# Patient Record
Sex: Female | Born: 1965 | Race: White | Hispanic: No | Marital: Married | Smoking: Former smoker
Health system: Southern US, Community
[De-identification: ages and names within clinical notes are randomized; demographics above are authoritative.]

## PROBLEM LIST (undated history)

## (undated) DIAGNOSIS — T7840XA Allergy, unspecified, initial encounter: Secondary | ICD-10-CM

## (undated) DIAGNOSIS — F419 Anxiety disorder, unspecified: Secondary | ICD-10-CM

## (undated) DIAGNOSIS — K589 Irritable bowel syndrome without diarrhea: Secondary | ICD-10-CM

## (undated) DIAGNOSIS — F32A Depression, unspecified: Secondary | ICD-10-CM

## (undated) DIAGNOSIS — Z8719 Personal history of other diseases of the digestive system: Secondary | ICD-10-CM

## (undated) DIAGNOSIS — K219 Gastro-esophageal reflux disease without esophagitis: Secondary | ICD-10-CM

## (undated) DIAGNOSIS — M1990 Unspecified osteoarthritis, unspecified site: Secondary | ICD-10-CM

## (undated) DIAGNOSIS — D649 Anemia, unspecified: Secondary | ICD-10-CM

## (undated) DIAGNOSIS — F329 Major depressive disorder, single episode, unspecified: Secondary | ICD-10-CM

## (undated) DIAGNOSIS — E785 Hyperlipidemia, unspecified: Secondary | ICD-10-CM

## (undated) HISTORY — DX: Hyperlipidemia, unspecified: E78.5

## (undated) HISTORY — DX: Irritable bowel syndrome, unspecified: K58.9

## (undated) HISTORY — PX: TUBAL LIGATION: SHX77

## (undated) HISTORY — DX: Depression, unspecified: F32.A

## (undated) HISTORY — DX: Allergy, unspecified, initial encounter: T78.40XA

## (undated) HISTORY — DX: Personal history of other diseases of the digestive system: Z87.19

## (undated) HISTORY — DX: Unspecified osteoarthritis, unspecified site: M19.90

## (undated) HISTORY — DX: Gastro-esophageal reflux disease without esophagitis: K21.9

## (undated) HISTORY — PX: UPPER GASTROINTESTINAL ENDOSCOPY: SHX188

## (undated) HISTORY — DX: Anxiety disorder, unspecified: F41.9

## (undated) HISTORY — PX: WISDOM TOOTH EXTRACTION: SHX21

## (undated) HISTORY — PX: TOOTH EXTRACTION: SUR596

## (undated) HISTORY — DX: Anemia, unspecified: D64.9

## (undated) HISTORY — PX: POLYPECTOMY: SHX149

## (undated) HISTORY — PX: COLONOSCOPY: SHX174

## (undated) HISTORY — DX: Major depressive disorder, single episode, unspecified: F32.9

---

## 2000-03-25 ENCOUNTER — Other Ambulatory Visit: Admission: RE | Admit: 2000-03-25 | Discharge: 2000-03-25 | Payer: Self-pay | Admitting: Obstetrics and Gynecology

## 2002-04-17 ENCOUNTER — Other Ambulatory Visit: Admission: RE | Admit: 2002-04-17 | Discharge: 2002-04-17 | Payer: Self-pay | Admitting: Obstetrics & Gynecology

## 2004-05-17 ENCOUNTER — Ambulatory Visit: Payer: Self-pay | Admitting: Internal Medicine

## 2004-06-23 ENCOUNTER — Ambulatory Visit: Payer: Self-pay | Admitting: Internal Medicine

## 2005-04-30 HISTORY — PX: ENDOMETRIAL ABLATION: SHX621

## 2006-07-31 ENCOUNTER — Ambulatory Visit: Payer: Self-pay | Admitting: Internal Medicine

## 2006-08-08 ENCOUNTER — Ambulatory Visit: Payer: Self-pay | Admitting: Internal Medicine

## 2006-08-08 ENCOUNTER — Encounter (INDEPENDENT_AMBULATORY_CARE_PROVIDER_SITE_OTHER): Payer: Self-pay | Admitting: Specialist

## 2006-08-08 DIAGNOSIS — Z860100 Personal history of colon polyps, unspecified: Secondary | ICD-10-CM | POA: Insufficient documentation

## 2006-09-06 ENCOUNTER — Ambulatory Visit: Payer: Self-pay | Admitting: Internal Medicine

## 2007-01-22 ENCOUNTER — Ambulatory Visit (HOSPITAL_COMMUNITY): Admission: RE | Admit: 2007-01-22 | Discharge: 2007-01-22 | Payer: Self-pay | Admitting: Obstetrics & Gynecology

## 2009-08-22 ENCOUNTER — Encounter (INDEPENDENT_AMBULATORY_CARE_PROVIDER_SITE_OTHER): Payer: Self-pay | Admitting: *Deleted

## 2009-09-06 ENCOUNTER — Encounter (INDEPENDENT_AMBULATORY_CARE_PROVIDER_SITE_OTHER): Payer: Self-pay | Admitting: *Deleted

## 2009-09-06 ENCOUNTER — Telehealth: Payer: Self-pay | Admitting: Internal Medicine

## 2009-09-09 ENCOUNTER — Encounter (INDEPENDENT_AMBULATORY_CARE_PROVIDER_SITE_OTHER): Payer: Self-pay | Admitting: *Deleted

## 2009-10-13 ENCOUNTER — Encounter (INDEPENDENT_AMBULATORY_CARE_PROVIDER_SITE_OTHER): Payer: Self-pay | Admitting: *Deleted

## 2009-10-17 ENCOUNTER — Ambulatory Visit: Payer: Self-pay | Admitting: Internal Medicine

## 2009-10-20 ENCOUNTER — Telehealth (INDEPENDENT_AMBULATORY_CARE_PROVIDER_SITE_OTHER): Payer: Self-pay | Admitting: *Deleted

## 2009-10-28 ENCOUNTER — Ambulatory Visit: Payer: Self-pay | Admitting: Internal Medicine

## 2010-05-30 NOTE — Letter (Signed)
Summary: Out of Work  [facility]  [address] [place], [place] [address]   Phone: [phone]  Fax: [phone]    09/09/2009  TO:   Charlcie Cradle FAX:   [phone]  RE: Amanda Parrish [address] [id number]       The above named individual is currently under my care and will be out of work    FROM:    10/28/09     THROUGH:   10/28/09    REASON:   Colonoscopy and required prep    MAY RETURN ON: 11/01/09     If you have any further questions or need additional information, please call.     Sincerely,    Iva Boop, MD typed by: Francee Piccolo CMA (AAMA)

## 2010-05-30 NOTE — Letter (Signed)
Summary: [facility] Instructions  [facility]  [address] [place], [place] [address]   Phone: [phone]  Fax: [phone]       Amanda Parrish    June 04, 1965    MRN: [id number]        Procedure Day /Date: Friday 10/28/2009     Arrival Time: 9:30 am      Procedure Time: 10:30 am     Location of Procedure:                    _x _  [facility] (4th Floor)                        PREPARATION FOR COLONOSCOPY WITH MOVIPREP   Starting 5 days prior to your procedure Sunday 6/26 do not eat nuts, seeds, popcorn, corn, beans, peas,  salads, or any raw vegetables.  Do not take any fiber supplements (e.g. Metamucil, Citrucel, and Benefiber).  THE DAY BEFORE YOUR PROCEDURE         DATE: Thursday 6/30 1.  Drink clear liquids the entire day-NO SOLID FOOD  2.  Do not drink anything colored red or purple.  Avoid juices with pulp.  No orange juice.  3.  Drink at least 64 oz. (8 glasses) of fluid/clear liquids during the day to prevent dehydration and help the prep work efficiently.  CLEAR LIQUIDS INCLUDE: Water Jello Ice Popsicles Tea (sugar ok, no milk/cream) Powdered fruit flavored drinks Coffee (sugar ok, no milk/cream) Gatorade Juice: apple, white grape, white cranberry  Lemonade Clear bullion, consomm, broth Carbonated beverages (any kind) Strained chicken noodle soup Hard Candy                             4.  In the morning, mix first dose of MoviPrep solution:    Empty 1 Pouch A and 1 Pouch B into the disposable container    Add lukewarm drinking water to the top line of the container. Mix to dissolve    Refrigerate (mixed solution should be used within 24 hrs)  5.  Begin drinking the prep at 5:00 p.m. The MoviPrep container is divided by 4 marks.   Every 15 minutes drink the solution down to the next mark (approximately 8 oz) until the full liter is complete.   6.  Follow completed prep with 16 oz of clear liquid of your choice (Nothing  red or purple).  Continue to drink clear liquids until bedtime.  7.  Before going to bed, mix second dose of MoviPrep solution:    Empty 1 Pouch A and 1 Pouch B into the disposable container    Add lukewarm drinking water to the top line of the container. Mix to dissolve    Refrigerate  THE DAY OF YOUR PROCEDURE      DATE: Friday 7/1  Beginning at 5:30 a.m. (5 hours before procedure):         1. Every 15 minutes, drink the solution down to the next mark (approx 8 oz) until the full liter is complete.  2. Follow completed prep with 16 oz. of clear liquid of your choice.    3. You may drink clear liquids until 8:30 am (2 HOURS BEFORE PROCEDURE).   MEDICATION INSTRUCTIONS  Unless otherwise instructed, you should take regular prescription medications with a small sip of water   as early as possible the morning of your procedure.  Diabetic patients - see separate instructions.  Stop taking Plavix or Aggrenox on  _  _  (7 days before procedure).     Stop taking Coumadin on  _ _  (5 days before procedure).  Additional medication instructions: _         OTHER INSTRUCTIONS  You will need a responsible adult at least 44 years of age to accompany you and drive you home.   This person must remain in the waiting room during your procedure.  Wear loose fitting clothing that is easily removed.  Leave jewelry and other valuables at home.  However, you may wish to bring a book to read or  an iPod/MP3 player to listen to music as you wait for your procedure to start.  Remove all body piercing jewelry and leave at home.  Total time from sign-in until discharge is approximately 2-3 hours.  You should go home directly after your procedure and rest.  You can resume normal activities the  day after your procedure.  The day of your procedure you should not:   Drive   Make legal decisions   Operate machinery   Drink alcohol   Return to work  You will receive specific  instructions about eating, activities and medications before you leave.    The above instructions have been reviewed and explained to me by   _______________________    I fully understand and can verbalize these instructions _____________________________ Date _________

## 2010-05-30 NOTE — Progress Notes (Signed)
Summary: prep ?'s   Phone Note Call from Patient Call back at [phone]   Caller: Patient Call For: Dr. Leone Payor Summary of Call: prep ?'s Initial call taken by: Vallarie Mare,  October 20, 2009 4:38 PM  Follow-up for Phone Call        Left message Follow-up by: Wyona Almas RN,  October 21, 2009 7:44 AM  Additional Follow-up for Phone Call Additional follow up Details #1::        Answered questions re: foods allowed 5 days prior to colon. Additional Follow-up by: Wyona Almas RN,  October 21, 2009 9:45 AM

## 2010-05-30 NOTE — Miscellaneous (Signed)
Summary: REC COL...AS.  Clinical Lists Changes  Medications: Added new medication of MOVIPREP 100 GM  SOLR (PEG-KCL-NACL-NASULF-NA ASC-C) As directed - Signed Rx of MOVIPREP 100 GM  SOLR (PEG-KCL-NACL-NASULF-NA ASC-C) As directed;  #1 x 0;  Signed;  Entered by: Clide Cliff RN;  Authorized by: Iva Boop MD, [facility];  Method used: [facility]  [id number] [address], [place], [place], [place]  [address], Ph: [phone], Fax: [phone] Observations: Added new observation of ALLERGY REV: Done (10/17/2009 14:20)    Prescriptions: MOVIPREP 100 GM  SOLR (PEG-KCL-NACL-NASULF-NA ASC-C) As directed  #1 x 0   Entered by:   Clide Cliff RN   Authorized by:   Iva Boop MD, [facility]   Signed by:   Clide Cliff RN on 10/17/2009   Method used:   [facility]  [address] [id number]       [address]       [place], [place]  [address]       Ph: [phone]       Fax: [phone]   RxID:   [id number]

## 2010-05-30 NOTE — Procedures (Signed)
Summary: Colonoscopy  Patient: Amanda Parrish Note: All result statuses are Final unless otherwise noted.  Tests: (1) Colonoscopy (COL)   COL Colonoscopy           DONE     [facility]     [address]     [place], [place]  [address]           COLONOSCOPY PROCEDURE REPORT           PATIENT:  Amanda Parrish, Amanda Parrish  MR#:  [id number]     BIRTHDATE:  1966-01-14, 43 yrs. old  GENDER:  female     ENDOSCOPIST:  Iva Boop, MD, [facility]           PROCEDURE DATE:  10/28/2009     PROCEDURE:  Colonoscopy [address]     ASA CLASS:  Class I     INDICATIONS:  surveillance and high-risk screening, history of     pre-cancerous (adenomatous) colon polyps two diminutive adenomas     removed 07/2006     mother, maternal aunt, great maternal aunt all have polyps     MEDICATIONS:   Benadryl 25 mg IV, Versed 9 mg IV, Fentanyl 75 mcg     IV           DESCRIPTION OF PROCEDURE:   After the risks benefits and     alternatives of the procedure were thoroughly explained, informed     consent was obtained.  Digital rectal exam was performed and     revealed no abnormalities.   The LB [id number]-[id number] endoscope     was introduced through the anus and advanced to the cecum, which     was identified by both the appendix and ileocecal valve, without     limitations.  The quality of the prep was adequate, using     MoviPrep.  The instrument was then slowly withdrawn as the colon     was fully examined. Insertion: 6:54 minutes Withdrawal: 9:43     minutes     <<PROCEDUREIMAGES>>           FINDINGS:  A normal appearing cecum, ileocecal valve, and     appendiceal orifice were identified. The ascending, hepatic     flexure, transverse, splenic flexure, descending, sigmoid colon,     and rectum appeared unremarkable.   Retroflexed views in the right     colon and  rectum revealed no abnormalities.    The scope was then     withdrawn from the patient and the procedure completed.           COMPLICATIONS:   None     ENDOSCOPIC IMPRESSION:     1) Normal colonoscopy, adequate prep     2) Personal history of adenomatous polyps and family history of     polyps.           RECOMMENDATIONS:     Use deep sedation next time. Strong IBS response to scope in     left colon.           REPEAT EXAM:  In 5 year(s) for routine screening colonoscopy.           Iva Boop, MD, [facility]           CC:  Herb Grays, MD     The Patient           n.     Rosalie Doctor:   Iva Boop at 10/28/2009 10:58 AM  Aashi, Derrington, [phone]  Note: An exclamation mark (!) indicates a result that was not dispersed into the flowsheet. Document Creation Date: 10/28/2009 10:58 AM _______________________________________________________________________  (1) Order result status: Final Collection or observation date-time: 10/28/2009 10:48 Requested date-time:  Receipt date-time:  Reported date-time:  Referring Physician:   Ordering Physician: Stan Head [phone]) Specimen Source:  Source: Launa Grill Order Number: [id number] Lab site:   Appended Document: Colonoscopy    Clinical Lists Changes  Observations: Added new observation of COLONNXTDUE: 10/2014 (10/28/2009 11:32)

## 2010-05-30 NOTE — Letter (Signed)
Summary: Colonoscopy Letter  [facility]  [address] [place], [place] [address]   Phone: [phone]  Fax: [phone]      August 22, 2009 MRN: [id number]   GUIDA ASMAN [address] [facility] [address] [place]  [address]   Dear [name],   According to your medical record, it is time for you to schedule a Colonoscopy. The American Cancer Society recommends this procedure as a method to detect early colon cancer. Patients with a family history of colon cancer, or a personal history of colon polyps or inflammatory bowel disease are at increased risk.  This letter has beeen generated based on the recommendations made at the time of your procedure. If you feel that in your particular situation this may no longer apply, please contact our office.  Please call our office at [phone] to schedule this appointment or to update your records at your earliest convenience.  Thank you for cooperating with [place] to provide you with the very best care possible.   Sincerely,  Iva Boop, M.D.  [facility] Gastroenterology Division [phone]

## 2010-05-30 NOTE — Letter (Signed)
Summary: Previsit letter  [facility]  [address] [place], [place] [address]   Phone: [phone]  Fax: [phone]       09/06/2009 MRN: [id number]  Amanda Parrish [address] [place]  [address]  Dear Ms. Amanda Parrish,  Welcome to the Gastroenterology Division at [facility].    You are scheduled to see a nurse for your pre-procedure visit on 10/17/2009 at 2:30PM on the 3rd floor at [facility], [address]  We ask that you try to arrive at our office 15 minutes prior to your appointment time to allow for check-in.  Your nurse visit will consist of discussing your medical and surgical history, your immediate family medical history, and your medications.    Please bring a complete list of all your medications or, if you prefer, bring the medication bottles and we will list them.  We will need to be aware of both prescribed and over the counter drugs.  We will need to know exact dosage information as well.  If you are on blood thinners (Coumadin, Plavix, Aggrenox, Ticlid, etc.) please call our office today/prior to your appointment, as we need to consult with your physician about holding your medication.   Please be prepared to read and sign documents such as consent forms, a financial agreement, and acknowledgement forms.  If necessary, and with your consent, a friend or relative is welcome to sit-in on the nurse visit with you.  Please bring your insurance card so that we may make a copy of it.  If your insurance requires a referral to see a specialist, please bring your referral form from your primary care physician.  No co-pay is required for this nurse visit.     If you cannot keep your appointment, please call [phone] to cancel or reschedule prior to your appointment date.  This allows [place] the opportunity to schedule an appointment for another patient in need of care.    Thank you for choosing [facility] for your medical needs.   We appreciate the opportunity to care for you.  Please visit [place] at our website  to learn more about our practice.                     Sincerely.                                                                                                                   The Gastroenterology Division

## 2010-05-30 NOTE — Progress Notes (Signed)
Summary: Appt verification   Phone Note Call from Patient   Call For: Dr Leone Payor Summary of Call: Needs a fax sent to her employer. Appt falls on 10-28-09 right before the holliday. Faxed to [facility] [phone]. Initial call taken by: Leanor Kail [facility],  Sep 06, 2009 5:02 PM  Follow-up for Phone Call        [facility] to pt.  Messgae left for pt to return call. Francee Piccolo CMA Duncan Dull)  Sep 09, 2009 12:15 PM   RC from pt.  Pt requests a letter stating she is having a colonoscopy and will need to be absent from work the entire day.  Per patient her employer does not want to grant time off before a holiday.  Advised pt I would fax a letter today.  Verified supervisor's name and fax number. Follow-up by: Francee Piccolo CMA Duncan Dull),  Sep 09, 2009 12:47 PM

## 2010-09-12 NOTE — Assessment & Plan Note (Signed)
[facility]                         GASTROENTEROLOGY OFFICE NOTE   NAME:Amanda Parrish                    MRN:          [id number]  DATE:09/06/2006                            DOB:          [birth year]    CHIEF COMPLAINT:  Constipation, bloating.   Ms. Amanda Parrish had a colonoscopy recently.  I had seen her in 2006.  See  that workup for full details.  She previously had iron deficiency anemia  problems.  A colonoscopy was recommended at that time, but she did not  have it.  Dr. Collins Scotland sent her back.  She had 2 small adenomatous polyps.  She has a family history of colon cancer in 2nd degree and 3rd degree  relatives, and her mother and other 2nd degree relatives have polyps.  She will come back in 3 years.  She had a strong IBS response and mild  melanosis coli.  She moves her bowels once a week or so using stool  softeners.  She exercises regularly.  She is fit.  She drinks a lot of  water, but it just does not help her move her bowels.   MEDICATIONS:  1. Lexapro 10 mg daily.  2. Clonipin 0.5 mg.  3. Stool softener.  4. Claritin p.r.n.   DRUG ALLERGIES:  None known.   PAST MEDICAL HISTORY:  1. Depression and anxiety.  2. Allergic rhinosinusitis.  3. Dyslipidemia.  4. Iron deficiency anemia, treated, resolved, apparently.  5. Esophageal stricture with esophageal dilation, June 23, 2004.  6. Small hiatal hernia.  7. Dysphagia, probably responded to clonazepam.  8. Negative workup for sprue with serologies.   PHYSICAL EXAMINATION:  Weight 139 pounds, pulse 64, blood pressure  112/50.   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  She knows she cannot tolerate gas-forming foods.  This is  a chronic problem for her.  She is doing everything she can, it sounds  like, with diet and exercise.   PLAN:  1. I would avoid pushing fiber in this lady with the gas and bloating.      We are going to start  her on the probiotic Align.  2. MiraLax 1 or 2 doses a day, titrating for effect.  She does have      some occasional times where she has multiple bowel movements and      kind of unloads things in a somewhat alternating fashion, but that      is rare.  She can skip it around those times.  3. If that fails to work, I have asked her to come back after a month      or more trial of this, and      we can consider Amitiza.  4. Also could consider antibiotics given the gas and bloating, but      will try this approach first.     Iva Boop, MD,FACG  Electronically Signed    CEG/MedQ  DD: 09/06/2006  DT: 09/06/2006  Job #: [id number]   cc:   Tammy R. Collins Scotland, M.D.
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
## 2010-10-18 ENCOUNTER — Other Ambulatory Visit: Payer: Self-pay | Admitting: Obstetrics and Gynecology

## 2010-10-18 DIAGNOSIS — R928 Other abnormal and inconclusive findings on diagnostic imaging of breast: Secondary | ICD-10-CM

## 2010-10-23 ENCOUNTER — Ambulatory Visit
Admission: RE | Admit: 2010-10-23 | Discharge: 2010-10-23 | Disposition: A | Discharge status: Home / Self Care | Payer: BC Managed Care – PPO | Source: Ambulatory Visit | Attending: Obstetrics and Gynecology | Admitting: Obstetrics and Gynecology

## 2010-10-23 DIAGNOSIS — R928 Other abnormal and inconclusive findings on diagnostic imaging of breast: Secondary | ICD-10-CM

## 2010-10-23 IMAGING — MG MM DIGITAL DIAGNOSTIC UNILAT R {BCG}
2 series · 2 of 2 positions shown · non-contrast
Comparison: [DATE] baseline screening mammogram.

CLINICAL DATA: Recall from screening mammography

DIGITAL DIAGNOSTIC RIGHT BREAST MAMMOGRAM

[R CC]
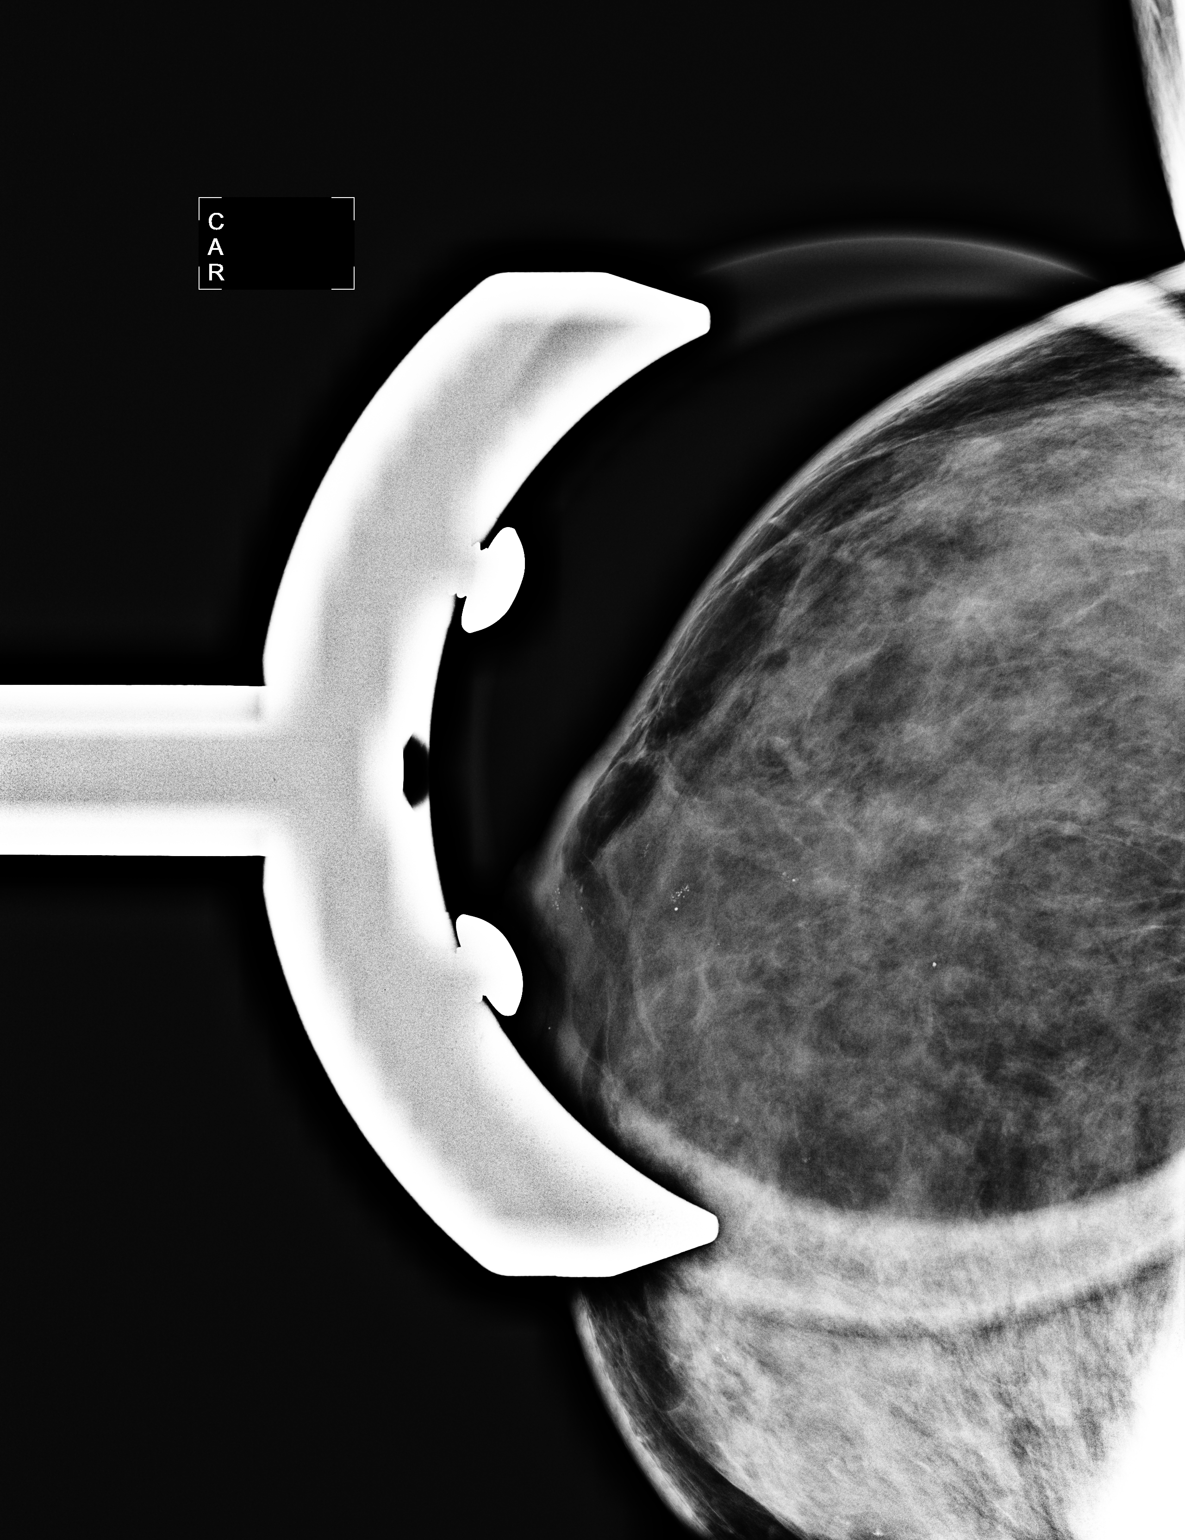

[R ML]
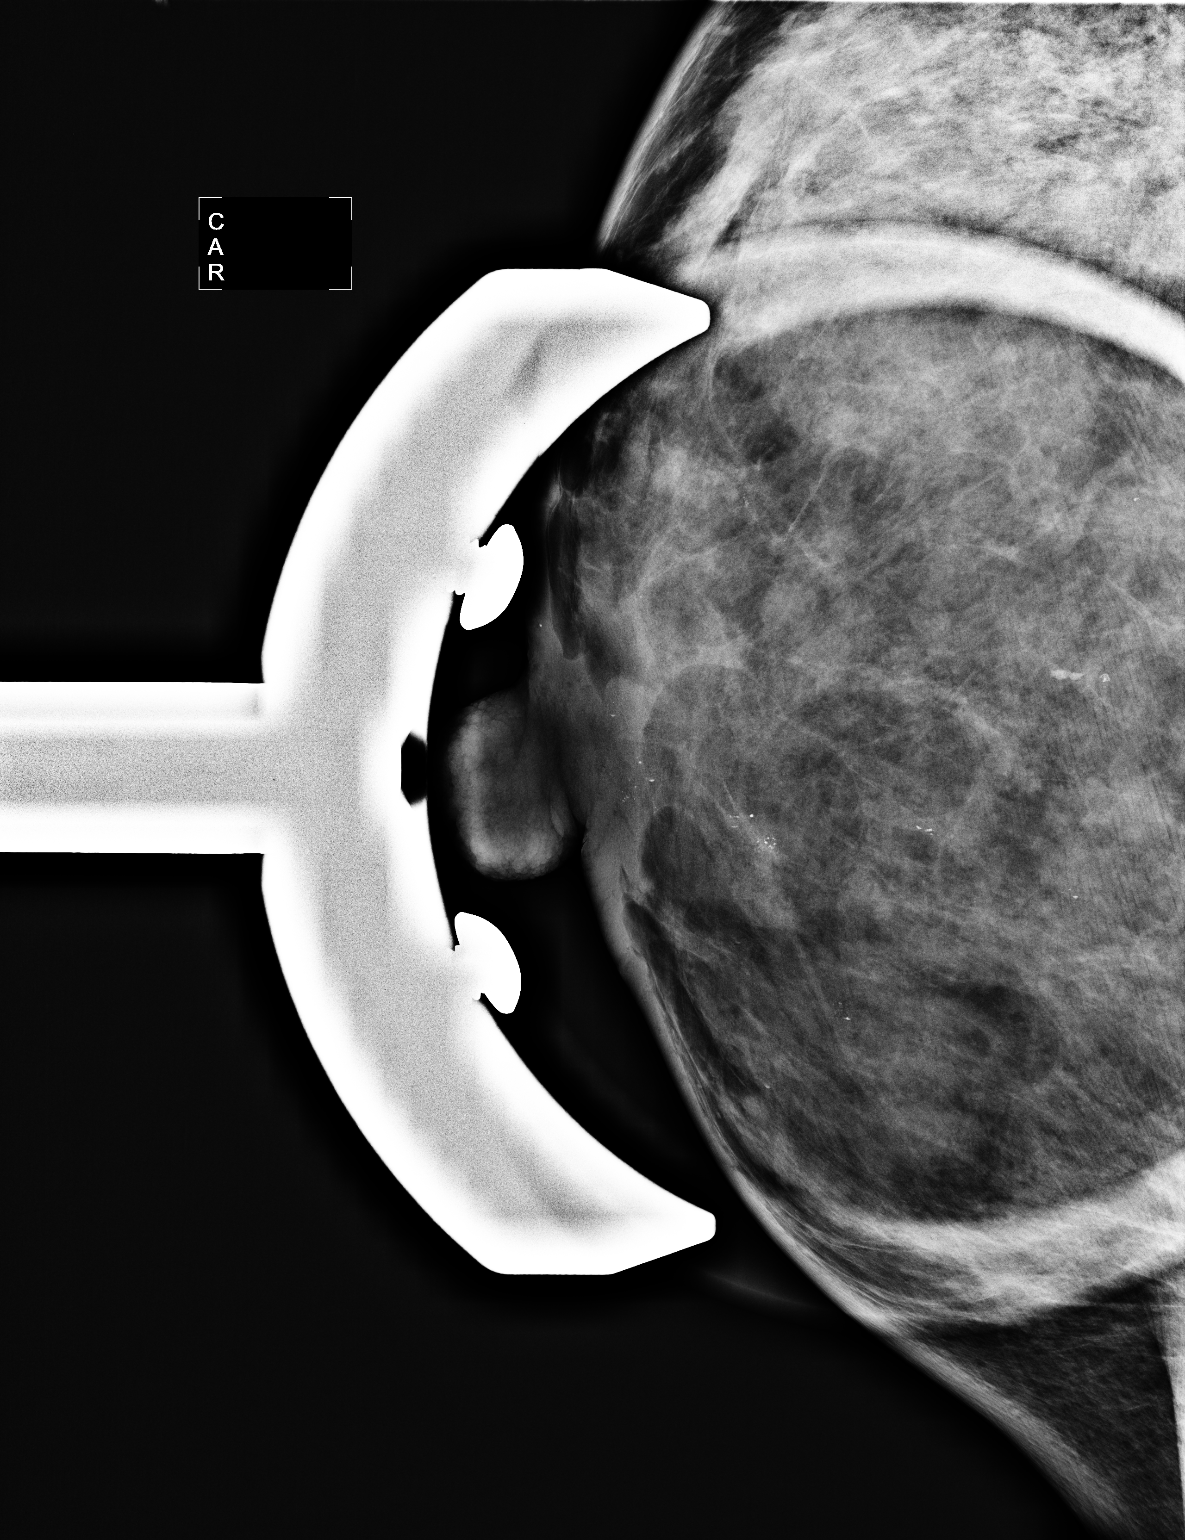

[2 of 2 positions shown; findings below may reference images not displayed]

FINDINGS: There are several small clusters of calcifications
located within the central right breast extending laterally.  These
are punctate in configuration.  Several of the calcifications layer
on the lateral view.  There are similar but less numerous
calcifications within the central and lateral left breast. These
are felt to represent probably benign calcifications .  I recommend
six, 12, and 24-month mammogram follow-up of the calcifications.
IMPRESSION: Probably benign calcifications located centrally and laterally
within the right breast as discussed above.  Recommend follow-up
right breast diagnostic mammogram in 6 months.

BI-RADS CATEGORY 3:  Probably benign finding(s) - short interval
follow-up suggested.

## 2011-02-08 LAB — CBC
MCHC: 31
Platelets: 419 — ABNORMAL HIGH
RDW: 19.2 — ABNORMAL HIGH

## 2014-01-13 ENCOUNTER — Ambulatory Visit: Payer: Self-pay | Admitting: Gynecology

## 2014-03-04 ENCOUNTER — Ambulatory Visit: Payer: Self-pay | Admitting: Gynecology

## 2014-03-04 ENCOUNTER — Ambulatory Visit (INDEPENDENT_AMBULATORY_CARE_PROVIDER_SITE_OTHER): Payer: BC Managed Care – PPO | Admitting: Gynecology

## 2014-03-04 ENCOUNTER — Other Ambulatory Visit (HOSPITAL_COMMUNITY)
Admission: RE | Admit: 2014-03-04 | Discharge: 2014-03-04 | Disposition: A | Discharge status: Home / Self Care | Payer: BC Managed Care – PPO | Source: Ambulatory Visit | Attending: Gynecology | Admitting: Gynecology

## 2014-03-04 ENCOUNTER — Encounter: Payer: Self-pay | Admitting: Gynecology

## 2014-03-04 VITALS — BP 120/70 | Ht 66.0 in | Wt 141.0 lb

## 2014-03-04 DIAGNOSIS — N3000 Acute cystitis without hematuria: Secondary | ICD-10-CM

## 2014-03-04 DIAGNOSIS — Z1151 Encounter for screening for human papillomavirus (HPV): Secondary | ICD-10-CM | POA: Diagnosis present

## 2014-03-04 DIAGNOSIS — Z01419 Encounter for gynecological examination (general) (routine) without abnormal findings: Secondary | ICD-10-CM

## 2014-03-04 DIAGNOSIS — N951 Menopausal and female climacteric states: Secondary | ICD-10-CM

## 2014-03-04 DIAGNOSIS — R5382 Chronic fatigue, unspecified: Secondary | ICD-10-CM

## 2014-03-04 DIAGNOSIS — E559 Vitamin D deficiency, unspecified: Secondary | ICD-10-CM

## 2014-03-04 DIAGNOSIS — R140 Abdominal distension (gaseous): Secondary | ICD-10-CM

## 2014-03-04 LAB — URINALYSIS W MICROSCOPIC + REFLEX CULTURE
BILIRUBIN URINE: NEGATIVE
CRYSTALS: NONE SEEN
Casts: NONE SEEN
Glucose, UA: NEGATIVE mg/dL
Hgb urine dipstick: NEGATIVE
KETONES UR: NEGATIVE mg/dL
Nitrite: NEGATIVE
PROTEIN: NEGATIVE mg/dL
RBC / HPF: NONE SEEN RBC/hpf (ref <3)
Specific Gravity, Urine: <1.005 — ABNORMAL LOW (ref 1.005–1.030)
Urobilinogen, UA: 0.2 mg/dL (ref 0.0–1.0)
pH: 6.5 (ref 5.0–8.0)

## 2014-03-04 MED ORDER — SULFAMETHOXAZOLE-TRIMETHOPRIM 800-160 MG PO TABS: 1.0000 | ORAL_TABLET | Freq: Two times a day (BID) | ORAL | Status: DC

## 2014-03-04 MED ORDER — FLUCONAZOLE 150 MG PO TABS: 150.0000 mg | ORAL_TABLET | Freq: Once | ORAL | Status: DC

## 2014-03-04 NOTE — Patient Instructions (Addendum)
Follow up for ultrasound as scheduled. Take the antibiotic twice daily for 3 days. Use the fluconazole pill if needed for vaginal itching or discharge.  Call to Schedule your mammogram  Facilities in [place]: 1)  [facility], [address] Phone: [phone] 2)  [facility]. [address] [place] Phone: [phone] 3)  Dr. Isaiah Blakes at [facility] [address] Phone: [phone]     Mammogram A mammogram is an X-ray test to find changes in a woman's breast. You should get a mammogram if:  You are 75 years of age or older  You have risk factors.   Your doctor recommends that you have one.  BEFORE THE TEST  Do not schedule the test the week before your period, especially if your breasts are sore during this time.  On the day of your mammogram:  Wash your breasts and armpits well. After washing, do not put on any deodorant or talcum powder on until after your test.   Eat and drink as you usually do.   Take your medicines as usual.   If you are diabetic and take insulin, make sure you:   Eat before coming for your test.   Take your insulin as usual.   If you cannot keep your appointment, call before the appointment to cancel. Schedule another appointment.  TEST  You will need to undress from the waist up. You will put on a hospital gown.   Your breast will be put on the mammogram machine, and it will press firmly on your breast with a piece of plastic called a compression paddle. This will make your breast flatter so that the machine can X-ray all parts of your breast.   Both breasts will be X-rayed. Each breast will be X-rayed from above and from the side. An X-ray might need to be taken again if the picture is not good enough.   The mammogram will last about 15 to 30 minutes.  AFTER THE TEST Finding out the results of your test Ask when your test results will be  ready. Make sure you get your test results.  Document Released: 07/13/2008 Document Revised: 04/05/2011 Document Reviewed: 07/13/2008 [facility] Patient Information 2012 [place].    You may obtain a copy of any labs that were done today by logging onto MyChart as outlined in the instructions provided with your AVS (after visit summary). The office will not call with normal lab results but certainly if there are any significant abnormalities then we will contact you.   Health Maintenance, Female A healthy lifestyle and preventative care can promote health and wellness.  Maintain regular health, dental, and eye exams.  Eat a healthy diet. Foods like vegetables, fruits, whole grains, low-fat dairy products, and lean protein foods contain the nutrients you need without too many calories. Decrease your intake of foods high in solid fats, added sugars, and salt. Get information about a proper diet from your caregiver, if necessary.  Regular physical exercise is one of the most important things you can do for your health. Most adults should get at least 150 minutes of moderate-intensity exercise (any activity that increases your heart rate and causes you to sweat) each week. In addition, most adults need muscle-strengthening exercises on 2 or more days a week.   Maintain a healthy weight. The body mass index (BMI) is a screening tool to identify possible weight problems. It provides an  estimate of body fat based on height and weight. Your caregiver can help determine your BMI, and can help you achieve or maintain a healthy weight. For adults 20 years and older:  A BMI below 18.5 is considered underweight.  A BMI of 18.5 to 24.9 is normal.  A BMI of 25 to 29.9 is considered overweight.  A BMI of 30 and above is considered obese.  Maintain normal blood lipids and cholesterol by exercising and minimizing your intake of saturated fat. Eat a balanced diet with plenty of fruits and vegetables.  Blood tests for lipids and cholesterol should begin at age 8 and be repeated every 5 years. If your lipid or cholesterol levels are high, you are over 50, or you are a high risk for heart disease, you may need your cholesterol levels checked more frequently.Ongoing high lipid and cholesterol levels should be treated with medicines if diet and exercise are not effective.  If you smoke, find out from your caregiver how to quit. If you do not use tobacco, do not start.  Lung cancer screening is recommended for adults aged 7 80 years who are at high risk for developing lung cancer because of a history of smoking. Yearly low-dose computed tomography (CT) is recommended for people who have at least a 30-pack-year history of smoking and are a current smoker or have quit within the past 15 years. A pack year of smoking is smoking an average of 1 pack of cigarettes a day for 1 year (for example: 1 pack a day for 30 years or 2 packs a day for 15 years). Yearly screening should continue until the smoker has stopped smoking for at least 15 years. Yearly screening should also be stopped for people who develop a health problem that would prevent them from having lung cancer treatment.  If you are pregnant, do not drink alcohol. If you are breastfeeding, be very cautious about drinking alcohol. If you are not pregnant and choose to drink alcohol, do not exceed 1 drink per day. One drink is considered to be 12 ounces (355 mL) of beer, 5 ounces (148 mL) of wine, or 1.5 ounces (44 mL) of liquor.  Avoid use of street drugs. Do not share needles with anyone. Ask for help if you need support or instructions about stopping the use of drugs.  High blood pressure causes heart disease and increases the risk of stroke. Blood pressure should be checked at least every 1 to 2 years. Ongoing high blood pressure should be treated with medicines, if weight loss and exercise are not effective.  If you are 72 to 47 years old, ask your  caregiver if you should take aspirin to prevent strokes.  Diabetes screening involves taking a blood sample to check your fasting blood sugar level. This should be done once every 3 years, after age 16, if you are within normal weight and without risk factors for diabetes. Testing should be considered at a younger age or be carried out more frequently if you are overweight and have at least 1 risk factor for diabetes.  Breast cancer screening is essential preventative care for women. You should practice "breast self-awareness." This means understanding the normal appearance and feel of your breasts and may include breast self-examination. Any changes detected, no matter how small, should be reported to a caregiver. Women in their 23s and 30s should have a clinical breast exam (CBE) by a caregiver as part of a regular health exam every 1 to 3 years.  After age 64, women should have a CBE every year. Starting at age 17, women should consider having a mammogram (breast X-ray) every year. Women who have a family history of breast cancer should talk to their caregiver about genetic screening. Women at a high risk of breast cancer should talk to their caregiver about having an MRI and a mammogram every year.  Breast cancer gene (BRCA)-related cancer risk assessment is recommended for women who have family members with BRCA-related cancers. BRCA-related cancers include breast, ovarian, tubal, and peritoneal cancers. Having family members with these cancers may be associated with an increased risk for harmful changes (mutations) in the breast cancer genes BRCA1 and BRCA2. Results of the assessment will determine the need for genetic counseling and BRCA1 and BRCA2 testing.  The Pap test is a screening test for cervical cancer. Women should have a Pap test starting at age 78. Between ages 65 and 49, Pap tests should be repeated every 2 years. Beginning at age 47, you should have a Pap test every 3 years as long as the  past 3 Pap tests have been normal. If you had a hysterectomy for a problem that was not cancer or a condition that could lead to cancer, then you no longer need Pap tests. If you are between ages 25 and 66, and you have had normal Pap tests going back 10 years, you no longer need Pap tests. If you have had past treatment for cervical cancer or a condition that could lead to cancer, you need Pap tests and screening for cancer for at least 20 years after your treatment. If Pap tests have been discontinued, risk factors (such as a new sexual partner) need to be reassessed to determine if screening should be resumed. Some women have medical problems that increase the chance of getting cervical cancer. In these cases, your caregiver may recommend more frequent screening and Pap tests.  The human papillomavirus (HPV) test is an additional test that may be used for cervical cancer screening. The HPV test looks for the virus that can cause the cell changes on the cervix. The cells collected during the Pap test can be tested for HPV. The HPV test could be used to screen women aged 65 years and older, and should be used in women of any age who have unclear Pap test results. After the age of 35, women should have HPV testing at the same frequency as a Pap test.  Colorectal cancer can be detected and often prevented. Most routine colorectal cancer screening begins at the age of 4 and continues through age 50. However, your caregiver may recommend screening at an earlier age if you have risk factors for colon cancer. On a yearly basis, your caregiver may provide home test kits to check for hidden blood in the stool. Use of a small camera at the end of a tube, to directly examine the colon (sigmoidoscopy or colonoscopy), can detect the earliest forms of colorectal cancer. Talk to your caregiver about this at age 75, when routine screening begins. Direct examination of the colon should be repeated every 5 to 10 years through  age 39, unless early forms of pre-cancerous polyps or small growths are found.  Hepatitis C blood testing is recommended for all people born from [birth year] through 1964 and any individual with known risks for hepatitis C.  Practice safe sex. Use condoms and avoid high-risk sexual practices to reduce the spread of sexually transmitted infections (STIs). Sexually active women aged 37 and  younger should be checked for Chlamydia, which is a common sexually transmitted infection. Older women with new or multiple partners should also be tested for Chlamydia. Testing for other STIs is recommended if you are sexually active and at increased risk.  Osteoporosis is a disease in which the bones lose minerals and strength with aging. This can result in serious bone fractures. The risk of osteoporosis can be identified using a bone density scan. Women ages 63 and over and women at risk for fractures or osteoporosis should discuss screening with their caregivers. Ask your caregiver whether you should be taking a calcium supplement or vitamin D to reduce the rate of osteoporosis.  Menopause can be associated with physical symptoms and risks. Hormone replacement therapy is available to decrease symptoms and risks. You should talk to your caregiver about whether hormone replacement therapy is right for you.  Use sunscreen. Apply sunscreen liberally and repeatedly throughout the day. You should seek shade when your shadow is shorter than you. Protect yourself by wearing long sleeves, pants, a wide-brimmed hat, and sunglasses year round, whenever you are outdoors.  Notify your caregiver of new moles or changes in moles, especially if there is a change in shape or color. Also notify your caregiver if a mole is larger than the size of a pencil eraser.  Stay current with your immunizations. Document Released: 10/30/2010 Document Revised: 08/11/2012 Document Reviewed: 10/30/2010 [facility] Patient Information 2014 [place].

## 2014-03-04 NOTE — Progress Notes (Signed)
Amanda Parrish [birth year] [id number]        48 y.o.  [id number] new patient for annual exam.  Several issues noted below.  Past medical history,surgical history, problem list, medications, allergies, family history and social history were all reviewed and documented as reviewed in the EPIC chart.  ROS:  12 system ROS performed with pertinent positives and negatives included in the history, assessment and plan.   Additional significant findings :  none   Exam: [facility] personassistant Filed Vitals:   03/04/14 1604  BP: 120/70  Height: 5\' 6"  (1.676 m)  Weight: 141 lb (63.957 kg)   General appearance:  Normal affect, orientation and appearance. Skin: Grossly normal HEENT: Without gross lesions.  No cervical or supraclavicular adenopathy. Thyroid normal.  Lungs:  Clear without wheezing, rales or rhonchi Cardiac: RR, without RMG Abdominal:  Soft, nontender, without masses, guarding, rebound, organomegaly or hernia Breasts:  Examined lying and sitting without masses, retractions, discharge or axillary adenopathy. Pelvic:  Ext/BUS/vagina normal.  Cervix normal. Pap/HPV done  Uterus axial, generous in size, midline and mobile nontender   Adnexa  Without masses or tenderness    Anus and perineum  Normal   Rectovaginal  Normal sphincter tone without palpated masses or tenderness.    Assessment/Plan:  48 y.o. [id number] female for annual exam without menses, tubal sterilization.   1. Amenorrhea status post NovaSure endometrial ablation. She does note some pelvic bloating that comes and goes and is concerned about possibility of cancer. Her exam shows her uterus to be generous in size. Also was told before that she had a fibroid. Will check baseline ultrasound for pelvic surveillance. She's done no bleeding and overall doing well from that standpoint. 2. UTI. Patient is having some mild dysuria, odor to her urine and frequency. Urinalysis consistent with UTI. Will treat with Septra DS 1 by mouth twice a  day 3 days. She says she frequently will get yeast infections after antibiotics I gave her a Diflucan 150 mg tablet to have available if needed. Follow up if symptoms persist, worsen or recur. 3. Mild menopausal symptoms. Also feels fatigued and just tired.  No hair skin or weight changes.  Slight hot flashes and sweats. Thyroid palpates normal.  Will check baseline FSH TSH.  Issues of HRT reviewed to include the WHI study with risks of stroke heart attack DVT and breast cancer. This point if she does have an elevated FSH she is not interested in HRT but will follow up if her symptoms worsen and she wants to initiate. 4. History of vitamin D deficiency. Is on extra vitamin D. We'll check vitamin D level today. 5. Mammography overdo in 2013. Patient knows to schedule now agrees to do so. SBE monthly reviewed. 6. Pap smear 2013. No history of abnormal Pap smears previously. Pap/HPV today as I have no record of her prior Pap smears. 7. Colonoscopy 2011. Repeat at their recommended interval. 8. Health maintenance. Baseline CBC comprehensive metabolic panel lipid profile urinalysis TSH FSH vitamin D ordered. Follow up for the ultrasound and lab work.     Dara LordsFONTAINE,Lorriann Hansmann P MD, 4:43 PM 03/04/2014

## 2014-03-04 NOTE — Addendum Note (Signed)
Addended by: Dayna BarkerGARDNER, Jariah Jarmon K on: 03/04/2014 04:54 PM   Modules accepted: Orders, SmartSet

## 2014-03-05 ENCOUNTER — Ambulatory Visit: Payer: Self-pay | Admitting: Gynecology

## 2014-03-05 ENCOUNTER — Telehealth: Payer: Self-pay

## 2014-03-05 LAB — LIPID PANEL
Cholesterol: 273 mg/dL — ABNORMAL HIGH (ref 0–200)
HDL: 49 mg/dL (ref >39)
LDL CALC: 209 mg/dL — AB (ref 0–99)
TRIGLYCERIDES: 73 mg/dL (ref <150)
Total CHOL/HDL Ratio: 5.6 Ratio
VLDL: 15 mg/dL (ref 0–40)

## 2014-03-05 LAB — CBC WITH DIFFERENTIAL/PLATELET
BASOS PCT: 0 % (ref 0–1)
Basophils Absolute: 0 10*3/uL (ref 0.0–0.1)
EOS ABS: 0.1 10*3/uL (ref 0.0–0.7)
Eosinophils Relative: 1 % (ref 0–5)
HCT: 39.9 % (ref 36.0–46.0)
HEMOGLOBIN: 13.4 g/dL (ref 12.0–15.0)
Lymphocytes Relative: 25 % (ref 12–46)
Lymphs Abs: 1.8 10*3/uL (ref 0.7–4.0)
MCH: 30.5 pg (ref 26.0–34.0)
MCHC: 33.6 g/dL (ref 30.0–36.0)
MCV: 90.9 fL (ref 78.0–100.0)
MONO ABS: 0.4 10*3/uL (ref 0.1–1.0)
MONOS PCT: 5 % (ref 3–12)
NEUTROS PCT: 69 % (ref 43–77)
Neutro Abs: 5 10*3/uL (ref 1.7–7.7)
Platelets: 247 10*3/uL (ref 150–400)
RBC: 4.39 MIL/uL (ref 3.87–5.11)
RDW: 13 % (ref 11.5–15.5)
WBC: 7.3 10*3/uL (ref 4.0–10.5)

## 2014-03-05 LAB — COMPREHENSIVE METABOLIC PANEL
ALK PHOS: 48 U/L (ref 39–117)
ALT: <8 U/L (ref 0–35)
AST: 16 U/L (ref 0–37)
Albumin: 4.1 g/dL (ref 3.5–5.2)
BILIRUBIN TOTAL: 0.6 mg/dL (ref 0.2–1.2)
BUN: 12 mg/dL (ref 6–23)
CO2: 27 mEq/L (ref 19–32)
Calcium: 8.9 mg/dL (ref 8.4–10.5)
Chloride: 100 mEq/L (ref 96–112)
Creat: 0.79 mg/dL (ref 0.50–1.10)
Glucose, Bld: 87 mg/dL (ref 70–99)
Potassium: 3.8 mEq/L (ref 3.5–5.3)
Sodium: 137 mEq/L (ref 135–145)
Total Protein: 7.1 g/dL (ref 6.0–8.3)

## 2014-03-05 LAB — FOLLICLE STIMULATING HORMONE: FSH: 5 m[IU]/mL

## 2014-03-05 LAB — TSH: TSH: 2.241 u[IU]/mL (ref 0.350–4.500)

## 2014-03-05 LAB — VITAMIN D 25 HYDROXY (VIT D DEFICIENCY, FRACTURES): Vit D, 25-Hydroxy: 29 ng/mL — ABNORMAL LOW (ref 30–89)

## 2014-03-05 NOTE — Telephone Encounter (Signed)
Patient said she has history of Vit D Def even having taken Vit D 50000 units  Weekly in the past.  For the last month she has been taking 3000 Units of Vitamin D every day.  How to recommend she proceed in light of this?

## 2014-03-05 NOTE — Telephone Encounter (Signed)
error 

## 2014-03-05 NOTE — Telephone Encounter (Signed)
-----   Message from Dara Lordsimothy P Fontaine, MD sent at 03/05/2014  1:27 PM EST ----- Tell patient: #1  Cholesterol is way too high at 273 and her LDL is way elevated at 209. If this is not fasting that she needs a fasting lipid profile. If it was fasting she is to follow up with a primary physician to consider medication. #2  Vitamin D level is low. Recommend 1000 units OTC supplement daily. And to stay on this

## 2014-03-05 NOTE — Telephone Encounter (Signed)
Given that history I would suggest 50,000 unit prescription strength weekly 12 weeks and then recheck a vitamin D level then. We may need to keep her on the 50,000 units every week or every other week but will see where she is 12 weeks from now.

## 2014-03-06 LAB — URINE CULTURE

## 2014-03-08 ENCOUNTER — Other Ambulatory Visit: Payer: Self-pay | Admitting: Gynecology

## 2014-03-08 DIAGNOSIS — E559 Vitamin D deficiency, unspecified: Secondary | ICD-10-CM

## 2014-03-08 MED ORDER — VITAMIN D (ERGOCALCIFEROL) 1.25 MG (50000 UNIT) PO CAPS
50000.0000 [IU] | ORAL_CAPSULE | ORAL | Status: DC
Start: 2014-03-08 — End: 2014-09-28

## 2014-03-08 NOTE — Telephone Encounter (Signed)
Left message to call.

## 2014-03-08 NOTE — Telephone Encounter (Signed)
Patient informed. Rx sent. Lab order placed. 

## 2014-03-09 LAB — CYTOLOGY - PAP

## 2014-03-18 ENCOUNTER — Ambulatory Visit: Payer: BC Managed Care – PPO | Admitting: Gynecology

## 2014-03-18 ENCOUNTER — Other Ambulatory Visit: Payer: BC Managed Care – PPO

## 2014-05-03 ENCOUNTER — Ambulatory Visit: Payer: BC Managed Care – PPO | Admitting: Gynecology

## 2014-05-03 ENCOUNTER — Other Ambulatory Visit: Payer: BC Managed Care – PPO

## 2014-05-27 ENCOUNTER — Ambulatory Visit: Payer: BC Managed Care – PPO | Admitting: Gynecology

## 2014-05-27 ENCOUNTER — Other Ambulatory Visit: Payer: BC Managed Care – PPO

## 2014-07-09 ENCOUNTER — Encounter: Payer: Self-pay | Admitting: Gynecology

## 2014-07-09 ENCOUNTER — Ambulatory Visit (INDEPENDENT_AMBULATORY_CARE_PROVIDER_SITE_OTHER): Payer: BC Managed Care – PPO | Admitting: Gynecology

## 2014-07-09 ENCOUNTER — Ambulatory Visit (INDEPENDENT_AMBULATORY_CARE_PROVIDER_SITE_OTHER): Payer: BC Managed Care – PPO

## 2014-07-09 VITALS — BP 118/78

## 2014-07-09 DIAGNOSIS — I8393 Asymptomatic varicose veins of bilateral lower extremities: Secondary | ICD-10-CM | POA: Diagnosis not present

## 2014-07-09 DIAGNOSIS — R140 Abdominal distension (gaseous): Secondary | ICD-10-CM | POA: Diagnosis not present

## 2014-07-09 DIAGNOSIS — N83201 Unspecified ovarian cyst, right side: Secondary | ICD-10-CM

## 2014-07-09 DIAGNOSIS — N8320 Unspecified ovarian cysts: Secondary | ICD-10-CM | POA: Diagnosis not present

## 2014-07-09 DIAGNOSIS — E559 Vitamin D deficiency, unspecified: Secondary | ICD-10-CM

## 2014-07-09 DIAGNOSIS — R102 Pelvic and perineal pain: Secondary | ICD-10-CM

## 2014-07-09 DIAGNOSIS — N83202 Unspecified ovarian cyst, left side: Secondary | ICD-10-CM

## 2014-07-09 NOTE — Progress Notes (Signed)
Felicita Gagehyllis B Cushman January 12, 1966 [id number]        49 y.o.  [id number] Presents with several issues. Patient was seen in November for annual exam and was complaining of some pelvic pressure. She is status post endometrial ablation with no menses. Not having any menopausal symptoms. I recommended ultrasound rule out hematometria or other pathology and she is following up now for this. Also complaining of varicose veins in both legs that are getting worse. They're causing her discomfort, aching and swelling. Patient also has a history of vitamin D deficiency and requests that I check a vitamin D level.  Past medical history,surgical history, problem list, medications, allergies, family history and social history were all reviewed and documented in the EPIC chart.  Directed ROS with pertinent positives and negatives documented in the history of present illness/assessment and plan.  Exam: Biomedical scientistBlanca assistant Filed Vitals:   07/09/14 1606  BP: 118/78   General appearance:  Normal Both legs are examined with multiple small to large varicose veins extending from her thighs to her feet. Bilateral bluish discoloration of both feet secondary to poor venous return. Good peripheral pulses bilaterally.  Ultrasound shows uterus normal size with one small myoma 17 x 15 x 10 mm. Endometrial echo 5.5 mm without evidence of hematometria. Left ovary with echo-free thin-walled avascular cyst 21 x 20 x 16 mm. Left ovary with more complex thin-walled slightly vascular cyst 26 x 20 x 31 mm. Right ovary with a more solid-appearing thin-walled slightly vascular mass 16 x 13 x 15 mm.  Assessment/Plan:  49 y.o. [id number] with:  1. History of vitamin D deficiency. Check vitamin D level today. 2. History of pelvic bloating. No evidence of hematometria after endometrial ablation. Bilateral ovarian cystic changes small, simple, questionable endometriomas versus physiologic. Recommend patient follow up for repeat ultrasound in 3-6 months  to rule out persistence suggesting endometriomas or enlargement suggesting neoplastic. Differential was discussed with her up to including ovarian tumor. Importance of follow up reviewed. Options for laparoscopy to rule out endometriosis as a source of her bloating and look at the ovarian cyst also discussed but this point she is more comfortable with expectant management. Recheck urinalysis today. 3. 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 Symptomatic to the patient with discomfort, aching swelling. Recommend she follow up with vein specialist for assessment and treatment.     Dara LordsFONTAINE,Payal Stanforth P MD, 4:29 PM 07/09/2014

## 2014-07-09 NOTE — Patient Instructions (Signed)
Followup in 3-6 months for repeat ultrasound. 

## 2014-07-10 LAB — URINALYSIS W MICROSCOPIC + REFLEX CULTURE
BILIRUBIN URINE: NEGATIVE
Bacteria, UA: NONE SEEN
CRYSTALS: NONE SEEN
Casts: NONE SEEN
Glucose, UA: NEGATIVE mg/dL
Hgb urine dipstick: NEGATIVE
Ketones, ur: NEGATIVE mg/dL
Leukocytes, UA: NEGATIVE
NITRITE: NEGATIVE
Protein, ur: NEGATIVE mg/dL
SPECIFIC GRAVITY, URINE: 1.007 (ref 1.005–1.030)
Squamous Epithelial / LPF: NONE SEEN
Urobilinogen, UA: 0.2 mg/dL (ref 0.0–1.0)
pH: 6.5 (ref 5.0–8.0)

## 2014-07-10 LAB — VITAMIN D 25 HYDROXY (VIT D DEFICIENCY, FRACTURES): Vit D, 25-Hydroxy: 21 ng/mL — ABNORMAL LOW (ref 30–100)

## 2014-07-13 ENCOUNTER — Other Ambulatory Visit: Payer: Self-pay | Admitting: Gynecology

## 2014-07-13 MED ORDER — VITAMIN D (ERGOCALCIFEROL) 1.25 MG (50000 UNIT) PO CAPS: 50000.0000 [IU] | ORAL_CAPSULE | ORAL | Status: DC

## 2014-09-28 ENCOUNTER — Other Ambulatory Visit: Payer: Self-pay | Admitting: Family Medicine

## 2014-09-28 ENCOUNTER — Ambulatory Visit (INDEPENDENT_AMBULATORY_CARE_PROVIDER_SITE_OTHER): Payer: BC Managed Care – PPO | Admitting: Family Medicine

## 2014-09-28 ENCOUNTER — Encounter: Payer: Self-pay | Admitting: Family Medicine

## 2014-09-28 VITALS — BP 116/68 | HR 63 | Temp 98.5°F | Ht 66.0 in | Wt 139.5 lb

## 2014-09-28 DIAGNOSIS — N390 Urinary tract infection, site not specified: Secondary | ICD-10-CM | POA: Diagnosis not present

## 2014-09-28 DIAGNOSIS — E559 Vitamin D deficiency, unspecified: Secondary | ICD-10-CM

## 2014-09-28 DIAGNOSIS — F411 Generalized anxiety disorder: Secondary | ICD-10-CM

## 2014-09-28 DIAGNOSIS — Z01419 Encounter for gynecological examination (general) (routine) without abnormal findings: Secondary | ICD-10-CM

## 2014-09-28 DIAGNOSIS — E785 Hyperlipidemia, unspecified: Secondary | ICD-10-CM | POA: Diagnosis not present

## 2014-09-28 DIAGNOSIS — F329 Major depressive disorder, single episode, unspecified: Secondary | ICD-10-CM | POA: Insufficient documentation

## 2014-09-28 DIAGNOSIS — J302 Other seasonal allergic rhinitis: Secondary | ICD-10-CM

## 2014-09-28 DIAGNOSIS — F419 Anxiety disorder, unspecified: Secondary | ICD-10-CM

## 2014-09-28 DIAGNOSIS — F32A Depression, unspecified: Secondary | ICD-10-CM | POA: Insufficient documentation

## 2014-09-28 LAB — COMPREHENSIVE METABOLIC PANEL
ALT: 12 U/L (ref 0–35)
AST: 18 U/L (ref 0–37)
Albumin: 4.1 g/dL (ref 3.5–5.2)
Alkaline Phosphatase: 43 U/L (ref 39–117)
BUN: 14 mg/dL (ref 6–23)
CHLORIDE: 104 meq/L (ref 96–112)
CO2: 25 meq/L (ref 19–32)
Calcium: 9.1 mg/dL (ref 8.4–10.5)
Creatinine, Ser: 0.69 mg/dL (ref 0.40–1.20)
GFR: 96.6 mL/min (ref >60.00)
Glucose, Bld: 94 mg/dL (ref 70–99)
Potassium: 4 mEq/L (ref 3.5–5.1)
Sodium: 135 mEq/L (ref 135–145)
Total Bilirubin: 0.7 mg/dL (ref 0.2–1.2)
Total Protein: 7.2 g/dL (ref 6.0–8.3)

## 2014-09-28 LAB — URINALYSIS
Bilirubin Urine: NEGATIVE
Hgb urine dipstick: NEGATIVE
Ketones, ur: NEGATIVE
Leukocytes, UA: NEGATIVE
Nitrite: NEGATIVE
Specific Gravity, Urine: <=1.005 — AB (ref 1.000–1.030)
Total Protein, Urine: NEGATIVE
Urine Glucose: NEGATIVE
Urobilinogen, UA: 0.2 (ref 0.0–1.0)
pH: 6 (ref 5.0–8.0)

## 2014-09-28 LAB — LIPID PANEL
CHOL/HDL RATIO: 5
Cholesterol: 242 mg/dL — ABNORMAL HIGH (ref 0–200)
HDL: 50.1 mg/dL (ref >39.00)
LDL CALC: 181 mg/dL — AB (ref 0–99)
NonHDL: 191.9
Triglycerides: 54 mg/dL (ref 0.0–149.0)
VLDL: 10.8 mg/dL (ref 0.0–40.0)

## 2014-09-28 LAB — VITAMIN D 25 HYDROXY (VIT D DEFICIENCY, FRACTURES): VITD: 35.41 ng/mL (ref 30.00–100.00)

## 2014-09-28 MED ORDER — ESCITALOPRAM OXALATE 10 MG PO TABS: 10.0000 mg | ORAL_TABLET | Freq: Every day | ORAL | Status: DC

## 2014-09-28 MED ORDER — ATORVASTATIN CALCIUM 20 MG PO TABS: 20.0000 mg | ORAL_TABLET | Freq: Every day | ORAL | Status: DC

## 2014-09-28 MED ORDER — VITAMIN D (ERGOCALCIFEROL) 1.25 MG (50000 UNIT) PO CAPS: 50000.0000 [IU] | ORAL_CAPSULE | ORAL | Status: DC

## 2014-09-28 MED ORDER — CLONAZEPAM 1 MG PO TABS: 1.0000 mg | ORAL_TABLET | Freq: Every day | ORAL | Status: DC | PRN

## 2014-09-28 NOTE — Assessment & Plan Note (Signed)
Check vit D today. eRx sent.

## 2014-09-28 NOTE — Progress Notes (Signed)
Subjective:   Patient ID: Amanda Parrish, female    DOB: 1965/05/16, 49 y.o.   MRN: [id number]  Amanda Parrish is a pleasant 49 y.o. year old female who presents to clinic today with Establish Care  on 09/28/2014  HPI:  Anxiety- started on lexapro 20 mg daily several years ago when she had some perimenopausal symptoms.  Also has a history of intermittent anxiety.  Has weaned herself down to 10 mg daily.  Takes very infrequent Klonipin for severe anxiety/panic.  HLD- strong FH of HLD. Has been on lipitor, pravachol (caused insomnia) and crestor.  Thinks lipitor worked best but has not had any rxs for HLD in sometime.  Vit D deficiency- has been on Vit D 50,000 IU weekly for months.  Prescribed by Dr. Audie BoxFontaine, GYN.  Seems to need to restart high dose Vit D every time she decreases dose of a couple of months.  Has had a little more fatigue.  Allergic rhinitis due to pollen- uses prn flonase.  Does not like the way oral antihistamines make her feel. Current Outpatient Prescriptions on File Prior to Visit  Medication Sig Dispense Refill  . BIOTIN PO Take by mouth.    . Cholecalciferol (VITAMIN D PO) Take by mouth.     No current facility-administered medications on file prior to visit.    No Known Allergies  Past Medical History  Diagnosis Date  . Vitamin D deficiency   . Colon polyp     Past Surgical History  Procedure Laterality Date  . Tubal ligation    . Endometrial ablation  2007    Novsure  . Tooth extraction    . Wisdom tooth extraction      Family History  Problem Relation Age of Onset  . Heart disease Father   . Cancer Paternal Grandfather     Prostate    History   Social History  . Marital Status: Divorced    Spouse Name: N/A  . Number of Children: N/A  . Years of Education: N/A   Occupational History  . Not on file.   Social History Main Topics  . Smoking status: Former Games developermoker  . Smokeless tobacco: Never Used  . Alcohol Use: 0.0 oz/week   0 Standard drinks or equivalent per week     Comment: Social  . Drug Use: No  . Sexual Activity: Yes    Birth Control/ Protection: Surgical     Comment: BTL   Other Topics Concern  . Not on file   Social History Narrative   The PMH, PSH, Social History, Family History, Medications, and allergies have been reviewed in [facility], and have been updated if relevant.   Review of Systems  Constitutional: Positive for fatigue. Negative for unexpected weight change.  HENT: Negative.   Eyes: Negative.   Respiratory: Negative.   Cardiovascular: Negative.   Gastrointestinal: Negative.   Endocrine: Negative.   Genitourinary: Negative.   Musculoskeletal: Negative.   Skin: Negative.   Allergic/Immunologic: Negative.   Neurological: Negative.   Hematological: Negative.   Psychiatric/Behavioral: Negative.   All other systems reviewed and are negative.      Objective:    BP 116/68 mmHg  Pulse 63  Temp(Src) 98.5 F (36.9 C) (Oral)  Ht 5\' 6"  (1.676 m)  Wt 139 lb 8 oz (63.277 kg)  BMI 22.53 kg/m2  SpO2 99%   Physical Exam  Constitutional: She is oriented to person, place, and time. She appears well-developed and well-nourished. No distress.  HENT:  Head: Normocephalic.  Eyes: Conjunctivae are normal.  Cardiovascular: Normal rate and regular rhythm.   Pulmonary/Chest: Effort normal and breath sounds normal.  Musculoskeletal: She exhibits no edema or tenderness.  Neurological: She is alert and oriented to person, place, and time. No cranial nerve deficit.  Skin: Skin is warm and dry.  Psychiatric: She has a normal mood and affect. Her behavior is normal. Judgment and thought content normal.  Nursing note and vitals reviewed.         Assessment & Plan:   HLD (hyperlipidemia) - Plan: Lipid panel, Comprehensive metabolic panel  Vitamin D deficiency - Plan: Vitamin D, 25-hydroxy  Encounter for routine gynecological examination - Plan: escitalopram (LEXAPRO) 10 MG  tablet  Generalized anxiety disorder  Recurrent UTI - Plan: Urinalysis  Other seasonal allergic rhinitis No Follow-up on file.

## 2014-09-28 NOTE — Assessment & Plan Note (Signed)
Continue current dose of lexapro- 10 mg daily. She will request her old records.

## 2014-09-28 NOTE — Assessment & Plan Note (Signed)
Stable with prn flonase.

## 2014-09-28 NOTE — Patient Instructions (Signed)
Nice to meet you. We will call you with your lab results and you can view them online. 

## 2014-09-28 NOTE — Progress Notes (Signed)
Pre visit review using our clinic review tool, if applicable. No additional management support is needed unless otherwise documented below in the visit note. 

## 2014-09-28 NOTE — Assessment & Plan Note (Signed)
Due for labs.  Check lipid panel today. Will likely need to restart lipitor. The patient indicates understanding of these issues and agrees with the plan.

## 2014-11-03 ENCOUNTER — Telehealth: Payer: Self-pay

## 2014-11-03 DIAGNOSIS — Z01419 Encounter for gynecological examination (general) (routine) without abnormal findings: Secondary | ICD-10-CM

## 2014-11-03 NOTE — Telephone Encounter (Signed)
Pt wanted to ck and see if Dr Dayton MartesAron received information from previous doctor so lexapro could be refilled to [facility] [address]. Pt was seen 09/28/14 to establish care and note said wait on records from previous physician. Pt request cb.

## 2014-11-04 MED ORDER — ESCITALOPRAM OXALATE 10 MG PO TABS: 10.0000 mg | ORAL_TABLET | Freq: Every day | ORAL | Status: DC

## 2014-11-04 NOTE — Telephone Encounter (Signed)
I do not see old records scanned.  Ok to refill one time only and please request records again.

## 2014-11-04 NOTE — Telephone Encounter (Signed)
Rx sent to pharmacy. Lm on pts vm and advised records were not received and will be required for any additional refills. Requested pt have previous PCP sent records

## 2014-11-17 ENCOUNTER — Ambulatory Visit (INDEPENDENT_AMBULATORY_CARE_PROVIDER_SITE_OTHER): Payer: BC Managed Care – PPO | Admitting: Women's Health

## 2014-11-17 ENCOUNTER — Encounter: Payer: Self-pay | Admitting: Women's Health

## 2014-11-17 VITALS — BP 118/80 | Ht 66.0 in | Wt 139.0 lb

## 2014-11-17 DIAGNOSIS — B3731 Acute candidiasis of vulva and vagina: Secondary | ICD-10-CM

## 2014-11-17 DIAGNOSIS — A499 Bacterial infection, unspecified: Secondary | ICD-10-CM | POA: Diagnosis not present

## 2014-11-17 DIAGNOSIS — B9689 Other specified bacterial agents as the cause of diseases classified elsewhere: Secondary | ICD-10-CM

## 2014-11-17 DIAGNOSIS — N898 Other specified noninflammatory disorders of vagina: Secondary | ICD-10-CM | POA: Diagnosis not present

## 2014-11-17 DIAGNOSIS — N760 Acute vaginitis: Secondary | ICD-10-CM | POA: Diagnosis not present

## 2014-11-17 DIAGNOSIS — R5383 Other fatigue: Secondary | ICD-10-CM | POA: Diagnosis not present

## 2014-11-17 DIAGNOSIS — N8320 Unspecified ovarian cysts: Secondary | ICD-10-CM

## 2014-11-17 DIAGNOSIS — N83201 Unspecified ovarian cyst, right side: Secondary | ICD-10-CM

## 2014-11-17 DIAGNOSIS — J029 Acute pharyngitis, unspecified: Secondary | ICD-10-CM

## 2014-11-17 DIAGNOSIS — R350 Frequency of micturition: Secondary | ICD-10-CM

## 2014-11-17 DIAGNOSIS — N83202 Unspecified ovarian cyst, left side: Secondary | ICD-10-CM

## 2014-11-17 DIAGNOSIS — B373 Candidiasis of vulva and vagina: Secondary | ICD-10-CM

## 2014-11-17 LAB — CBC WITH DIFFERENTIAL/PLATELET
BASOS ABS: 0 10*3/uL (ref 0.0–0.1)
Basophils Relative: 0 % (ref 0–1)
EOS ABS: 0.1 10*3/uL (ref 0.0–0.7)
Eosinophils Relative: 1 % (ref 0–5)
HCT: 41.4 % (ref 36.0–46.0)
Hemoglobin: 13.9 g/dL (ref 12.0–15.0)
LYMPHS PCT: 29 % (ref 12–46)
Lymphs Abs: 2.3 10*3/uL (ref 0.7–4.0)
MCH: 30.8 pg (ref 26.0–34.0)
MCHC: 33.6 g/dL (ref 30.0–36.0)
MCV: 91.6 fL (ref 78.0–100.0)
MONO ABS: 0.6 10*3/uL (ref 0.1–1.0)
MPV: 9.5 fL (ref 8.6–12.4)
Monocytes Relative: 7 % (ref 3–12)
Neutro Abs: 5 10*3/uL (ref 1.7–7.7)
Neutrophils Relative %: 63 % (ref 43–77)
Platelets: 269 10*3/uL (ref 150–400)
RBC: 4.52 MIL/uL (ref 3.87–5.11)
RDW: 13 % (ref 11.5–15.5)
WBC: 8 10*3/uL (ref 4.0–10.5)

## 2014-11-17 LAB — TSH: TSH: 1.628 u[IU]/mL (ref 0.350–4.500)

## 2014-11-17 LAB — URINALYSIS W MICROSCOPIC + REFLEX CULTURE
Bilirubin Urine: NEGATIVE
GLUCOSE, UA: NEGATIVE mg/dL
HGB URINE DIPSTICK: NEGATIVE
Ketones, ur: NEGATIVE mg/dL
LEUKOCYTES UA: NEGATIVE
NITRITE: NEGATIVE
Protein, ur: NEGATIVE mg/dL
Specific Gravity, Urine: <1.005 — ABNORMAL LOW (ref 1.005–1.030)
Urobilinogen, UA: 0.2 mg/dL (ref 0.0–1.0)
pH: 6.5 (ref 5.0–8.0)

## 2014-11-17 LAB — WET PREP FOR TRICH, YEAST, CLUE: Trich, Wet Prep: NONE SEEN

## 2014-11-17 MED ORDER — FLUCONAZOLE 150 MG PO TABS: 150.0000 mg | ORAL_TABLET | Freq: Once | ORAL | Status: DC

## 2014-11-17 MED ORDER — METRONIDAZOLE 0.75 % VA GEL: VAGINAL | Status: DC

## 2014-11-17 NOTE — Addendum Note (Signed)
Addended by: Kem ParkinsonBARNES, Chauncy Mangiaracina on: 11/17/2014 02:49 PM   Modules accepted: Orders

## 2014-11-17 NOTE — Progress Notes (Signed)
Patient ID: Amanda Parrish, Amanda Parrish   DOB: [birth year], 49 y.o.   MRN: [id number] Presents with several complaints. States is having increased urinary pressure, frequency, vaginal itching with burning and odor for the past week. Also having a sore throat, increased fatigue, lack of concentration due to fatigue. States questions a fever over the past week none today. Able to go to work daily, states generally not feeling well. Denies abdominal pain. History of questionable endometriomas noted on ultrasound 06/2014. Same partner. Amenorrheic, history of endometrial ablation.  Exam: Appears well, affect flat. Throat mild erythema no visible white patches. No cervical lymphadenopathy. No CVAT, external genitalia within normal limits, speculum exam scant white discharge no odor noted wet prep positive for yeast, clues, TNTC bacteria. Bimanual no CMT or adnexal tenderness or fullness. UA: Negative  Yeast vaginitis BV Fatigue with sore throat Urinary frequency  Plan: CBC, TSH, strep throat culture taken and is pending. Urine culture pending. MetroGel vaginal cream 1 applicator at bedtime 5, alcohol precautions reviewed. Diflucan 150 by mouth with 1 refill. Schedule follow-up ultrasound. Reviewed normality of urine. Will call if no relief of urinary and vaginal symptoms.

## 2014-11-17 NOTE — Patient Instructions (Signed)
Monilial Vaginitis Vaginitis in a soreness, swelling and redness (inflammation) of the vagina and vulva. Monilial vaginitis is not a sexually transmitted infection. CAUSES  Yeast vaginitis is caused by yeast (candida) that is normally found in your vagina. With a yeast infection, the candida has overgrown in number to a point that upsets the chemical balance. SYMPTOMS   White, thick vaginal discharge.  Swelling, itching, redness and irritation of the vagina and possibly the lips of the vagina (vulva).  Burning or painful urination.  Painful intercourse. DIAGNOSIS  Things that may contribute to monilial vaginitis are:  Postmenopausal and virginal states.  Pregnancy.  Infections.  Being tired, sick or stressed, especially if you had monilial vaginitis in the past.  Diabetes. Good control will help lower the chance.  Birth control pills.  Tight fitting garments.  Using bubble bath, feminine sprays, douches or deodorant tampons.  Taking certain medications that kill germs (antibiotics).  Sporadic recurrence can occur if you become ill. TREATMENT  Your caregiver will give you medication.  There are several kinds of anti monilial vaginal creams and suppositories specific for monilial vaginitis. For recurrent yeast infections, use a suppository or cream in the vagina 2 times a week, or as directed.  Anti-monilial or steroid cream for the itching or irritation of the vulva may also be used. Get your caregiver's permission.  Painting the vagina with methylene blue solution may help if the monilial cream does not work.  Eating yogurt may help prevent monilial vaginitis. HOME CARE INSTRUCTIONS   Finish all medication as prescribed.  Do not have sex until treatment is completed or after your caregiver tells you it is okay.  Take warm sitz baths.  Do not douche.  Do not use tampons, especially scented ones.  Wear cotton underwear.  Avoid tight pants and panty  hose.  Tell your sexual partner that you have a yeast infection. They should go to their caregiver if they have symptoms such as mild rash or itching.  Your sexual partner should be treated as well if your infection is difficult to eliminate.  Practice safer sex. Use condoms.  Some vaginal medications cause latex condoms to fail. Vaginal medications that harm condoms are:  Cleocin cream.  Butoconazole (Femstat).  Terconazole (Terazol) vaginal suppository.  Miconazole (Monistat) (may be purchased over the counter). SEEK MEDICAL CARE IF:   You have a temperature by mouth above 102 F (38.9 C).  The infection is getting worse after 2 days of treatment.  The infection is not getting better after 3 days of treatment.  You develop blisters in or around your vagina.  You develop vaginal bleeding, and it is not your menstrual period.  You have pain when you urinate.  You develop intestinal problems.  You have pain with sexual intercourse. Document Released: 01/24/2005 Document Revised: 07/09/2011 Document Reviewed: 10/08/2008 ExitCare Patient Information 2015 [employer]. This information is not intended to replace advice given to you by your health care provider. Make sure you discuss any questions you have with your health care provider. Bacterial Vaginosis Bacterial vaginosis is a vaginal infection that occurs when the normal balance of bacteria in the vagina is disrupted. It results from an overgrowth of certain bacteria. This is the most common vaginal infection in women of childbearing age. Treatment is important to prevent complications, especially in pregnant women, as it can cause a premature delivery. CAUSES  Bacterial vaginosis is caused by an increase in harmful bacteria that are normally present in smaller amounts in the   vagina. Several different kinds of bacteria can cause bacterial vaginosis. However, the reason that the condition develops is not fully  understood. RISK FACTORS Certain activities or behaviors can put you at an increased risk of developing bacterial vaginosis, including:  Having a new sex partner or multiple sex partners.  Douching.  Using an intrauterine device (IUD) for contraception. Women do not get bacterial vaginosis from toilet seats, bedding, swimming pools, or contact with objects around them. SIGNS AND SYMPTOMS  Some women with bacterial vaginosis have no signs or symptoms. Common symptoms include:  Grey vaginal discharge.  A fishlike odor with discharge, especially after sexual intercourse.  Itching or burning of the vagina and vulva.  Burning or pain with urination. DIAGNOSIS  Your health care provider will take a medical history and examine the vagina for signs of bacterial vaginosis. A sample of vaginal fluid may be taken. Your health care provider will look at this sample under a microscope to check for bacteria and abnormal cells. A vaginal pH test may also be done.  TREATMENT  Bacterial vaginosis may be treated with antibiotic medicines. These may be given in the form of a pill or a vaginal cream. A second round of antibiotics may be prescribed if the condition comes back after treatment.  HOME CARE INSTRUCTIONS   Only take over-the-counter or prescription medicines as directed by your health care provider.  If antibiotic medicine was prescribed, take it as directed. Make sure you finish it even if you start to feel better.  Do not have sex until treatment is completed.  Tell all sexual partners that you have a vaginal infection. They should see their health care provider and be treated if they have problems, such as a mild rash or itching.  Practice safe sex by using condoms and only having one sex partner. SEEK MEDICAL CARE IF:   Your symptoms are not improving after 3 days of treatment.  You have increased discharge or pain.  You have a fever. MAKE SURE YOU:   Understand these  instructions.  Will watch your condition.  Will get help right away if you are not doing well or get worse. FOR MORE INFORMATION  Centers for Disease Control and Prevention, Division of STD Prevention: www.cdc.gov/std American Sexual Health Association (ASHA): www.ashastd.org  Document Released: 04/16/2005 Document Revised: 02/04/2013 Document Reviewed: 11/26/2012 ExitCare Patient Information 2015 [employer]. This information is not intended to replace advice given to you by your health care provider. Make sure you discuss any questions you have with your health care provider.  

## 2014-11-17 NOTE — Addendum Note (Signed)
Addended by: Kem ParkinsonBARNES, Deniz Eskridge on: 11/17/2014 02:50 PM   Modules accepted: Orders

## 2014-11-17 NOTE — Addendum Note (Signed)
Addended by: Kem ParkinsonBARNES, Hailee Hollick on: 11/17/2014 02:52 PM   Modules accepted: Orders

## 2014-11-18 ENCOUNTER — Other Ambulatory Visit: Payer: Self-pay | Admitting: Women's Health

## 2014-11-18 ENCOUNTER — Telehealth: Payer: Self-pay

## 2014-11-18 DIAGNOSIS — B3731 Acute candidiasis of vulva and vagina: Secondary | ICD-10-CM

## 2014-11-18 DIAGNOSIS — B373 Candidiasis of vulva and vagina: Secondary | ICD-10-CM

## 2014-11-18 LAB — RAPID STREP SCREEN (MED CTR MEBANE ONLY): Streptococcus, Group A Screen (Direct): POSITIVE — AB

## 2014-11-18 MED ORDER — AMOXICILLIN 500 MG PO CAPS: 500.0000 mg | ORAL_CAPSULE | Freq: Three times a day (TID) | ORAL | Status: DC

## 2014-11-18 MED ORDER — FLUCONAZOLE 150 MG PO TABS: 150.0000 mg | ORAL_TABLET | Freq: Once | ORAL | Status: DC

## 2014-11-18 NOTE — Telephone Encounter (Signed)
Thank you for handling, I appreciate

## 2014-11-18 NOTE — Telephone Encounter (Signed)
Patient was informed earlier today that her throat strep culture was positive. She left work and went and got her medication and went home to rest. She plans to stay out and rest tomorrow knowing now that she is contagious. She asked for a note to be faxed to her employer excusing her absence today and tomorrow. Letter was faxed to her employer. Copy is in chart.  [name], I did not send this to you first. I knew it would be fine to send the letter. Thanks

## 2014-11-19 LAB — URINE CULTURE

## 2015-01-04 ENCOUNTER — Telehealth: Payer: Self-pay | Admitting: Family Medicine

## 2015-01-04 ENCOUNTER — Encounter: Payer: Self-pay | Admitting: Internal Medicine

## 2015-01-04 NOTE — Telephone Encounter (Signed)
Since she is Dr Elmer Sow pt  - and the appt with me is in 3 days - you may want to call her if something opens up with her pcp (ie: cancellation) before her appt with me but I am happy to see her   Will cc her PCP

## 2015-01-04 NOTE — Telephone Encounter (Signed)
Pt has appt scheduled 01/07/15 with Dr Milinda Antis.

## 2015-01-04 NOTE — Telephone Encounter (Signed)
Patient Name: Amanda Parrish  DOB: [birth year]    Initial Comment **Caller request a call back at 1:00pm**Caller states she has discomfort in the middle of her back.    Nurse Assessment  Nurse: Sherilyn Cooter, RN, Thurmond Butts Date/Time Lamount Cohen Time): 01/04/2015 12:28:14 PM  Confirm and document reason for call. If symptomatic, describe symptoms. ---Caller states that she is with a customer, can we call back at 1:00pm? Advised her that I will put a note on the chart and we will try to call back as close to the time as possible. She verbalized understanding.  Has the patient traveled out of the country within the last 30 days? ---Not Applicable  Does the patient require triage? ---No    Nurse: Deatra James, RN, Corrie Dandy Date/Time (Eastern Time): 01/04/2015 1:00:02 PM  Confirm and document reason for call. If symptomatic, describe symptoms. ---Patient states she is having pain between the shoulder blades, and in the left shoulder. States she applied heat and that helped it. States she is having indigestion off an on as well.  Has the patient traveled out of the country within the last 30 days? ---No  Does the patient require triage? ---Yes  Related visit to physician within the last 2 weeks? ---No  Does the PT have any chronic conditions? (i.e. diabetes, asthma, etc.) ---No  Did the patient indicate they were pregnant? ---No     Guidelines    Guideline Title Affirmed Question Affirmed Notes  Back Pain [1] MODERATE back pain (e.g., interferes with normal activities) AND [2] present > 3 days    Final Disposition User   See PCP When Office is Open (within 3 days) Deatra James, RN, Corrie Dandy    Comments  Patient instructed per nurses own knowledge that she can try taking an antacid for the indigestion and she needs to call us back if the pain starts going into the chest or her indigestion is not relieved by the antacid.   Disagree/Comply: Comply

## 2015-01-05 NOTE — Telephone Encounter (Signed)
Spoke with Dr. Dayton Martes and she does have some appt avail tomorrow 01/06/15 and we can reschedule her then if she pt would like to.   Called pt and no answer so left voicemail requesting pt to call office back

## 2015-01-07 ENCOUNTER — Ambulatory Visit: Payer: Self-pay | Admitting: Family Medicine

## 2015-01-20 ENCOUNTER — Telehealth: Payer: Self-pay | Admitting: Family Medicine

## 2015-01-20 ENCOUNTER — Encounter: Payer: Self-pay | Admitting: Primary Care

## 2015-01-20 ENCOUNTER — Ambulatory Visit (INDEPENDENT_AMBULATORY_CARE_PROVIDER_SITE_OTHER): Payer: BC Managed Care – PPO | Admitting: Primary Care

## 2015-01-20 VITALS — BP 130/72 | HR 66 | Temp 98.2°F | Ht 66.0 in | Wt 147.1 lb

## 2015-01-20 DIAGNOSIS — M546 Pain in thoracic spine: Secondary | ICD-10-CM

## 2015-01-20 DIAGNOSIS — R5383 Other fatigue: Secondary | ICD-10-CM | POA: Insufficient documentation

## 2015-01-20 DIAGNOSIS — R8290 Unspecified abnormal findings in urine: Secondary | ICD-10-CM

## 2015-01-20 LAB — POCT URINALYSIS DIPSTICK
Bilirubin, UA: NEGATIVE
Blood, UA: NEGATIVE
Glucose, UA: NEGATIVE
Ketones, UA: NEGATIVE
LEUKOCYTES UA: NEGATIVE
NITRITE UA: NEGATIVE
PH UA: 6
PROTEIN UA: NEGATIVE
Spec Grav, UA: 1.01
Urobilinogen, UA: NEGATIVE

## 2015-01-20 LAB — CBC
HCT: 40.3 % (ref 36.0–46.0)
Hemoglobin: 13.5 g/dL (ref 12.0–15.0)
MCHC: 33.5 g/dL (ref 30.0–36.0)
MCV: 91.9 fl (ref 78.0–100.0)
PLATELETS: 310 10*3/uL (ref 150.0–400.0)
RBC: 4.38 Mil/uL (ref 3.87–5.11)
RDW: 12.6 % (ref 11.5–15.5)
WBC: 8.8 10*3/uL (ref 4.0–10.5)

## 2015-01-20 LAB — COMPREHENSIVE METABOLIC PANEL
ALBUMIN: 4.2 g/dL (ref 3.5–5.2)
ALT: 13 U/L (ref 0–35)
AST: 21 U/L (ref 0–37)
Alkaline Phosphatase: 53 U/L (ref 39–117)
BILIRUBIN TOTAL: 0.4 mg/dL (ref 0.2–1.2)
BUN: 24 mg/dL — ABNORMAL HIGH (ref 6–23)
CALCIUM: 9.4 mg/dL (ref 8.4–10.5)
CO2: 27 mEq/L (ref 19–32)
CREATININE: 0.75 mg/dL (ref 0.40–1.20)
Chloride: 102 mEq/L (ref 96–112)
GFR: 87.63 mL/min (ref >60.00)
Glucose, Bld: 83 mg/dL (ref 70–99)
Potassium: 4 mEq/L (ref 3.5–5.1)
Sodium: 137 mEq/L (ref 135–145)
Total Protein: 7.5 g/dL (ref 6.0–8.3)

## 2015-01-20 LAB — VITAMIN B12: VITAMIN B 12: 378 pg/mL (ref 211–911)

## 2015-01-20 LAB — VITAMIN D 25 HYDROXY (VIT D DEFICIENCY, FRACTURES): VITD: 62.99 ng/mL (ref 30.00–100.00)

## 2015-01-20 NOTE — Patient Instructions (Addendum)
Complete lab work prior to leaving today. I will notify you of your results.  Your ECG was abnormal but did not show a heart attack.  Your urine test did not show abnormality.  Start taking omeprazole 20 mg daily for 4 weeks for heartburn symptoms.  Continue using heating pads, icy hot, and aleve as needed for shoulder and back pain. It may take several weeks for your back, shoulder, and neck pain to improve.  Follow up with PCP if no improvement in symptoms in the next several weeks.

## 2015-01-20 NOTE — Telephone Encounter (Signed)
Pt has appt 01/20/15 at 1 pm with Mayra Reel NP.

## 2015-01-20 NOTE — Progress Notes (Signed)
Pre visit review using our clinic review tool, if applicable. No additional management support is needed unless otherwise documented below in the visit note. 

## 2015-01-20 NOTE — Telephone Encounter (Signed)
Patient Name: Amanda Parrish DOB: Sep 05, 1965 Initial Comment Caller states c/o pain between shoulder blades, fatigue, headache and intermittent shortness of breath. She states these symptoms have been going on for about a week. Nurse Assessment Nurse: Yetta Barre, RN, Miranda Date/Time (Eastern Time): 01/20/2015 9:22:42 AM Confirm and document reason for call. If symptomatic, describe symptoms. ---Caller states 2 weeks ago, she started having upper back pain. The pain was very intense and has gradually gotten some better and comes and goes. She has been having headaches off and on for 1 week, not having pain now. Right now has an ache in her left shoulder. The last few days has had fatigue and SOB for the last couple of days. She states right now she has "mild shortness or breath". Has the patient traveled out of the country within the last 30 days? ---Not Applicable Does the patient require triage? ---Yes Related visit to physician within the last 2 weeks? ---No Does the PT have any chronic conditions? (i.e. diabetes, asthma, etc.) ---Yes List chronic conditions. ---High Cholesterol, Allergies Did the patient indicate they were pregnant? ---No Guidelines Guideline Title Affirmed Question Affirmed Notes Breathing Difficulty [1] MILD difficulty breathing (e.g., minimal/no SOB at rest, SOB with walking, pulse <100) AND [2] NEW-onset or WORSE than normal Final Disposition User See Physician within 4 Hours (or PCP triage) Yetta Barre, RN, Miranda Comments No appt available with PCP. Appt scheduled for today at 1:00pm with Vernona Rieger, NP Referrals REFERRED TO PCP OFFICE Disagree/Comply: Comply

## 2015-01-20 NOTE — Progress Notes (Signed)
Subjective:    Patient ID: Amanda Parrish, female    DOB: Dec 27, 1965, 49 y.o.   MRN: [id number]  HPI  Ms. Amanda Parrish is a 49 year old female who presents today with a chief complaint of back pain. Her back pain is present to the thoracic midline and will radiate through to her scapula. She first noticed her pain on August 31st and has continued to notice her pain at rest and exertion intermittently. She has had some heartburn sporadically and does not take medication. She also reports bilateral shoulder and neck pain, and a dull frontal headache. For the past several days she's had shortness of breath after talking in long sentences and fatigue. She applied heating pads, icy hot, and took some aleve with relief.  She lifts weights and exercises on her elliptical. Two days ago she noticed a foul smell to her urine. Denies chest pain, cough, dysuria, fevers, palpitations.  Review of Systems  Constitutional: Positive for fatigue. Negative for fever and chills.  HENT: Negative for rhinorrhea.   Respiratory: Positive for shortness of breath. Negative for cough.   Cardiovascular: Negative for chest pain and palpitations.  Gastrointestinal: Negative for abdominal pain.  Genitourinary: Negative for dysuria, frequency and pelvic pain.  Neurological: Positive for headaches. Negative for dizziness and numbness.       Past Medical History  Diagnosis Date  . Vitamin D deficiency   . Colon polyp     Social History   Social History  . Marital Status: Divorced    Spouse Name: N/A  . Number of Children: N/A  . Years of Education: N/A   Occupational History  . Not on file.   Social History Main Topics  . Smoking status: Former Games developer  . Smokeless tobacco: Never Used  . Alcohol Use: 0.0 oz/week    0 Standard drinks or equivalent per week     Comment: Social  . Drug Use: No  . Sexual Activity: Yes    Birth Control/ Protection: Surgical     Comment: BTL   Other Topics Concern  . Not on  file   Social History Narrative    Past Surgical History  Procedure Laterality Date  . Tubal ligation    . Endometrial ablation  2007    [facility]  . Tooth extraction    . Wisdom tooth extraction      Family History  Problem Relation Age of Onset  . Heart disease Father   . Cancer Paternal Grandfather     Prostate    No Known Allergies  Current Outpatient Prescriptions on File Prior to Visit  Medication Sig Dispense Refill  . atorvastatin (LIPITOR) 20 MG tablet Take 1 tablet (20 mg total) by mouth daily. 90 tablet 3  . BIOTIN PO Take by mouth.    . clonazePAM (KLONOPIN) 1 MG tablet Take 1 tablet (1 mg total) by mouth daily as needed for anxiety. 30 tablet 1  . escitalopram (LEXAPRO) 10 MG tablet Take 1 tablet (10 mg total) by mouth daily. 30 tablet 0  . Vitamin D, Ergocalciferol, (DRISDOL) 50000 UNITS CAPS capsule Take 1 capsule (50,000 Units total) by mouth every 7 (seven) days. 12 capsule 0   No current facility-administered medications on file prior to visit.    BP 130/72 mmHg  Pulse 66  Temp(Src) 98.2 F (36.8 C) (Oral)  Ht  (1.676 m)  Wt 147 lb 1.9 oz (66.733 kg)  BMI 23.76 kg/m2  SpO2 97%    Objective:  Physical Exam  Constitutional: She is oriented to person, place, and time. She appears well-nourished.  Neck: Neck supple.  Cardiovascular: Normal rate and regular rhythm.   Pulmonary/Chest: Effort normal and breath sounds normal. She has no rales.  Musculoskeletal: Normal range of motion. She exhibits no tenderness.       Right shoulder: She exhibits normal range of motion, no tenderness, no crepitus, no pain and normal strength.       Left shoulder: She exhibits normal range of motion, no tenderness, no crepitus, no pain and normal strength.       Cervical back: She exhibits normal range of motion, no tenderness and no pain.       Thoracic back: She exhibits normal range of motion, no tenderness and no pain.  Lymphadenopathy:    She has no cervical  adenopathy.  Neurological: She is alert and oriented to person, place, and time.  Skin: Skin is warm and dry.  Psychiatric: She has a normal mood and affect.          Assessment & Plan:  Thoracic back pain:  Present since August 31st with radiation through scapula.  Exercises regularly, lifts weights, uses elliptical.  Exam unremarkable, no decreased strength or ROM. ECG abnormal but no indication of ACS or prior MI. Suspect this may be due to either over working muscles or fibromyalgia as she has fatigue and shoulder pain. Continue heat, icy hot, and aleve PRN. Follow up if no improvement in 2-3 weeks.  Shoulder pain bilaterally:  Exercises regularly, lifts weights, uses elliptical.  Exam unremarkable, no decreased strength or ROM, non tender. ECG abnormal but no indication of ACS or prior MI. Suspect this may be due to either over working muscles or fibromyalgia as she has fatigue and back pain. Continue heat, icy hot, and aleve PRN. Follow up if no improvement in 2-3 weeks.  ECG: NSR, with right BBB, widened QRS to V2. No St elevation. T-wave inversion noted to V2. Discussed this with PCP who does not recommend any further intervention.

## 2015-01-20 NOTE — Assessment & Plan Note (Signed)
Also with SOB when speaking in long sentences. Seems to be chronic after review medical record. Will check CBC, CMP, Vitamin B12, and Vitamin D. Pending. TSH in July WNL. Could be related to anxiety, or could be undiagnosed fibromyalgia. Vitals WNL.

## 2015-03-03 ENCOUNTER — Telehealth: Payer: Self-pay

## 2015-03-03 NOTE — Telephone Encounter (Signed)
Pt left v/m requesting new rx for lexapro 20 mg taking one daily. Med list has lexapro 10 mg taking one daily. Per 09/28/14 office note when pt established care was noted pt was taking lexapro 20 mg and pt weaned herself to lexapro 10 mg. Please advise.[facility] [address]. lexapro 10 mg last refilled # 30 on 11/04/2014.

## 2015-03-09 ENCOUNTER — Other Ambulatory Visit: Payer: Self-pay | Admitting: Family Medicine

## 2015-03-09 NOTE — Telephone Encounter (Signed)
Is pt needing to continue high dose? pls advise 

## 2015-04-04 ENCOUNTER — Ambulatory Visit: Payer: BC Managed Care – PPO | Admitting: Internal Medicine

## 2015-06-16 ENCOUNTER — Other Ambulatory Visit: Payer: Self-pay | Admitting: Family Medicine

## 2015-06-16 DIAGNOSIS — Z01419 Encounter for gynecological examination (general) (routine) without abnormal findings: Secondary | ICD-10-CM | POA: Insufficient documentation

## 2015-06-16 DIAGNOSIS — E785 Hyperlipidemia, unspecified: Secondary | ICD-10-CM

## 2015-06-16 DIAGNOSIS — E559 Vitamin D deficiency, unspecified: Secondary | ICD-10-CM

## 2015-06-21 ENCOUNTER — Other Ambulatory Visit (INDEPENDENT_AMBULATORY_CARE_PROVIDER_SITE_OTHER): Payer: BC Managed Care – PPO

## 2015-06-21 DIAGNOSIS — E785 Hyperlipidemia, unspecified: Secondary | ICD-10-CM | POA: Diagnosis not present

## 2015-06-21 DIAGNOSIS — E559 Vitamin D deficiency, unspecified: Secondary | ICD-10-CM | POA: Diagnosis not present

## 2015-06-21 DIAGNOSIS — Z0000 Encounter for general adult medical examination without abnormal findings: Secondary | ICD-10-CM

## 2015-06-21 DIAGNOSIS — Z01419 Encounter for gynecological examination (general) (routine) without abnormal findings: Secondary | ICD-10-CM

## 2015-06-21 LAB — CBC WITH DIFFERENTIAL/PLATELET
BASOS ABS: 0 10*3/uL (ref 0.0–0.1)
Basophils Relative: 0.5 % (ref 0.0–3.0)
EOS ABS: 0.2 10*3/uL (ref 0.0–0.7)
Eosinophils Relative: 3.1 % (ref 0.0–5.0)
HEMATOCRIT: 41.4 % (ref 36.0–46.0)
HEMOGLOBIN: 14.1 g/dL (ref 12.0–15.0)
LYMPHS PCT: 38.6 % (ref 12.0–46.0)
Lymphs Abs: 2.2 10*3/uL (ref 0.7–4.0)
MCHC: 33.9 g/dL (ref 30.0–36.0)
MCV: 91.1 fl (ref 78.0–100.0)
MONO ABS: 0.4 10*3/uL (ref 0.1–1.0)
Monocytes Relative: 6.8 % (ref 3.0–12.0)
Neutro Abs: 3 10*3/uL (ref 1.4–7.7)
Neutrophils Relative %: 51 % (ref 43.0–77.0)
Platelets: 248 10*3/uL (ref 150.0–400.0)
RBC: 4.55 Mil/uL (ref 3.87–5.11)
RDW: 13.1 % (ref 11.5–15.5)
WBC: 5.8 10*3/uL (ref 4.0–10.5)

## 2015-06-21 LAB — COMPREHENSIVE METABOLIC PANEL
ALBUMIN: 4.2 g/dL (ref 3.5–5.2)
ALT: 16 U/L (ref 0–35)
AST: 20 U/L (ref 0–37)
Alkaline Phosphatase: 42 U/L (ref 39–117)
BUN: 13 mg/dL (ref 6–23)
CHLORIDE: 104 meq/L (ref 96–112)
CO2: 28 mEq/L (ref 19–32)
CREATININE: 0.83 mg/dL (ref 0.40–1.20)
Calcium: 9.1 mg/dL (ref 8.4–10.5)
GFR: 77.82 mL/min (ref >60.00)
GLUCOSE: 95 mg/dL (ref 70–99)
Potassium: 4.3 mEq/L (ref 3.5–5.1)
SODIUM: 136 meq/L (ref 135–145)
Total Bilirubin: 0.9 mg/dL (ref 0.2–1.2)
Total Protein: 7.1 g/dL (ref 6.0–8.3)

## 2015-06-21 LAB — LIPID PANEL
CHOL/HDL RATIO: 4
CHOLESTEROL: 227 mg/dL — AB (ref 0–200)
HDL: 56.1 mg/dL (ref >39.00)
LDL CALC: 154 mg/dL — AB (ref 0–99)
NONHDL: 170.93
Triglycerides: 85 mg/dL (ref 0.0–149.0)
VLDL: 17 mg/dL (ref 0.0–40.0)

## 2015-06-21 LAB — TSH: TSH: 3.14 u[IU]/mL (ref 0.35–4.50)

## 2015-06-21 LAB — VITAMIN D 25 HYDROXY (VIT D DEFICIENCY, FRACTURES): VITD: 27.3 ng/mL — AB (ref 30.00–100.00)

## 2015-06-29 ENCOUNTER — Ambulatory Visit (INDEPENDENT_AMBULATORY_CARE_PROVIDER_SITE_OTHER): Payer: BC Managed Care – PPO | Admitting: Family Medicine

## 2015-06-29 ENCOUNTER — Encounter: Payer: Self-pay | Admitting: Family Medicine

## 2015-06-29 VITALS — BP 108/70 | HR 69 | Temp 98.1°F | Ht 66.0 in | Wt 153.0 lb

## 2015-06-29 DIAGNOSIS — Z01419 Encounter for gynecological examination (general) (routine) without abnormal findings: Secondary | ICD-10-CM

## 2015-06-29 DIAGNOSIS — F411 Generalized anxiety disorder: Secondary | ICD-10-CM | POA: Diagnosis not present

## 2015-06-29 DIAGNOSIS — E559 Vitamin D deficiency, unspecified: Secondary | ICD-10-CM

## 2015-06-29 DIAGNOSIS — E785 Hyperlipidemia, unspecified: Secondary | ICD-10-CM

## 2015-06-29 DIAGNOSIS — Z0000 Encounter for general adult medical examination without abnormal findings: Secondary | ICD-10-CM

## 2015-06-29 DIAGNOSIS — J302 Other seasonal allergic rhinitis: Secondary | ICD-10-CM

## 2015-06-29 MED ORDER — ESCITALOPRAM OXALATE 20 MG PO TABS: 20.0000 mg | ORAL_TABLET | Freq: Every day | ORAL | Status: DC

## 2015-06-29 MED ORDER — CLONAZEPAM 1 MG PO TABS: 1.0000 mg | ORAL_TABLET | Freq: Every day | ORAL | Status: DC | PRN

## 2015-06-29 MED ORDER — VITAMIN D (ERGOCALCIFEROL) 1.25 MG (50000 UNIT) PO CAPS: 50000.0000 [IU] | ORAL_CAPSULE | ORAL | Status: DC

## 2015-06-29 NOTE — Assessment & Plan Note (Signed)
Reviewed preventive care protocols, scheduled due services, and updated immunizations Discussed nutrition, exercise, diet, and healthy lifestyle.  

## 2015-06-29 NOTE — Addendum Note (Signed)
Addended by: Dianne Dun on: 06/29/2015 07:55 AM   Modules accepted: Kipp Brood

## 2015-06-29 NOTE — Progress Notes (Signed)
Pre visit review using our clinic review tool, if applicable. No additional management support is needed unless otherwise documented below in the visit note. 

## 2015-06-29 NOTE — Progress Notes (Addendum)
Subjective:   Patient ID: Amanda Parrish, female    DOB: Feb 22, 1966, 50 y.o.   MRN: [id number]  GELENE RECKTENWALD is a pleasant 50 y.o. year old female who presents to clinic today with Annual Exam  on 06/29/2015  HPI:  I have not seen her since she established care with me last May.  She does have a GYN- Dr. Audie Box.   Last pap smear 03/04/14. ?mammogram- per pt, not yet two years.   Anxiety- symptoms well controlled on current dose of lexapro.Takes very infrequent Klonipin for severe anxiety/panic.  HLD- strong FH of HLD. Has been on lipitor, pravachol (caused insomnia) and crestor.  I restarted her lipitor in 08/2014.  LDL has greatly improved.  Not quite at gaol.  Admits to taking lipitor every other day to minimize myalgias.  Lab Results  Component Value Date   CHOL 227* 06/21/2015   HDL 56.10 06/21/2015   LDLCALC 154* 06/21/2015   TRIG 85.0 06/21/2015   CHOLHDL 4 06/21/2015    Vit D deficiency- has been on Vit D 50,000 IU weekly for months.  Prescribed by Dr. Audie Box, GYN.  Seems to need to restart high dose Vit D every time she decreases dose of a couple of months.  Has had a little more fatigue. Vit D is low this month.  Lab Results  Component Value Date   TSH 3.14 06/21/2015   Lab Results  Component Value Date   ALT 16 06/21/2015   AST 20 06/21/2015   ALKPHOS 42 06/21/2015   BILITOT 0.9 06/21/2015   Lab Results  Component Value Date   NA 136 06/21/2015   K 4.3 06/21/2015   CL 104 06/21/2015   CO2 28 06/21/2015   Lab Results  Component Value Date   WBC 5.8 06/21/2015   HGB 14.1 06/21/2015   HCT 41.4 06/21/2015   MCV 91.1 06/21/2015   PLT 248.0 06/21/2015    Current Outpatient Prescriptions on File Prior to Visit  Medication Sig Dispense Refill  . atorvastatin (LIPITOR) 20 MG tablet Take 1 tablet (20 mg total) by mouth daily. 90 tablet 3  . BIOTIN PO Take by mouth.     No current facility-administered medications on file prior to visit.     Allergies  Allergen Reactions  . Bupropion Palpitations    Past Medical History  Diagnosis Date  . Vitamin D deficiency   . Colon polyp     Past Surgical History  Procedure Laterality Date  . Tubal ligation    . Endometrial ablation  2007    Novsure  . Tooth extraction    . Wisdom tooth extraction      Family History  Problem Relation Age of Onset  . Heart disease Father   . Cancer Paternal Grandfather     Prostate    Social History   Social History  . Marital Status: Divorced    Spouse Name: N/A  . Number of Children: N/A  . Years of Education: N/A   Occupational History  . Not on file.   Social History Main Topics  . Smoking status: Former Games developer  . Smokeless tobacco: Never Used  . Alcohol Use: 0.0 oz/week    0 Standard drinks or equivalent per week     Comment: Social  . Drug Use: No  . Sexual Activity: Yes    Birth Control/ Protection: Surgical     Comment: BTL   Other Topics Concern  . Not on file   Social History  Narrative   The PMH, PSH, Social History, Family History, Medications, and allergies have been reviewed in [facility], and have been updated if relevant.   Review of Systems  Constitutional: Positive for fatigue. Negative for unexpected weight change.  HENT: Negative.   Eyes: Negative.   Respiratory: Negative.   Cardiovascular: Negative.   Gastrointestinal: Negative.   Endocrine: Negative.   Genitourinary: Negative.   Musculoskeletal: Negative.   Skin: Negative.   Allergic/Immunologic: Negative.   Neurological: Negative.   Hematological: Negative.   Psychiatric/Behavioral: Negative.   All other systems reviewed and are negative.      Objective:    BP 108/70 mmHg  Pulse 69  Temp(Src) 98.1 F (36.7 C) (Tympanic)  Ht  (1.676 m)  Wt 153 lb (69.4 kg)  BMI 24.71 kg/m2  SpO2 98%  Wt Readings from Last 3 Encounters:  06/29/15 153 lb (69.4 kg)  01/20/15 147 lb 1.9 oz (66.733 kg)  11/17/14 139 lb (63.05 kg)      Physical Exam  Constitutional: She is oriented to person, place, and time. She appears well-developed and well-nourished. No distress.  HENT:  Head: Normocephalic and atraumatic.  Left Ear: External ear normal.  Eyes: Conjunctivae are normal.  Neck: Neck supple. No thyromegaly present.  Cardiovascular: Normal rate, regular rhythm and normal heart sounds.   Pulmonary/Chest: Effort normal and breath sounds normal.  Abdominal: Soft. Bowel sounds are normal. She exhibits no distension and no mass. There is no tenderness. There is no rebound and no guarding.  Musculoskeletal: Normal range of motion. She exhibits no edema or tenderness.  Neurological: She is alert and oriented to person, place, and time. No cranial nerve deficit.  Skin: Skin is warm and dry.  Psychiatric: She has a normal mood and affect. Her behavior is normal. Judgment and thought content normal.  Nursing note and vitals reviewed.         Assessment & Plan:   Well woman exam  Vitamin D deficiency  HLD (hyperlipidemia)  Generalized anxiety disorder  Other seasonal allergic rhinitis No Follow-up on file.

## 2015-06-29 NOTE — Assessment & Plan Note (Signed)
Improved.  Not quite at goal but she is working on diet and taking lipitor more frequently.

## 2015-06-29 NOTE — Assessment & Plan Note (Signed)
Deteriorated. Replete with high dose Vit D weekly 50,000 IU x 12 weeks. Follow up labs in 12-14 weeks. The patient indicates understanding of these issues and agrees with the plan.

## 2015-06-29 NOTE — Assessment & Plan Note (Signed)
Well controlled on current dose of lexapro with as needed klonipin. Rxs refilled today.

## 2015-06-29 NOTE — Patient Instructions (Signed)
Great to see you. Please return in 2-3 months to recheck your Vit D level.

## 2015-07-18 ENCOUNTER — Encounter: Payer: Self-pay | Admitting: Internal Medicine

## 2015-08-03 ENCOUNTER — Encounter: Payer: Self-pay | Admitting: Internal Medicine

## 2015-08-25 ENCOUNTER — Ambulatory Visit (AMBULATORY_SURGERY_CENTER): Payer: Self-pay | Admitting: *Deleted

## 2015-08-25 VITALS — Ht 66.0 in | Wt 152.0 lb

## 2015-08-25 DIAGNOSIS — Z86010 Personal history of colonic polyps: Secondary | ICD-10-CM

## 2015-08-25 NOTE — Progress Notes (Signed)
No egg or soy allergy known to patient  No issues with past sedation with any surgeries  or procedures, no intubation problems  No diet pills per patient No home 02 use per patient  No blood thinners per patient  Pt denies issues with constipation  emmi declined   

## 2015-08-29 ENCOUNTER — Encounter: Payer: Self-pay | Admitting: Internal Medicine

## 2015-09-08 ENCOUNTER — Ambulatory Visit (AMBULATORY_SURGERY_CENTER): Payer: BC Managed Care – PPO | Admitting: Internal Medicine

## 2015-09-08 ENCOUNTER — Encounter: Payer: Self-pay | Admitting: Internal Medicine

## 2015-09-08 VITALS — BP 103/66 | HR 58 | Temp 98.0°F | Resp 12 | Ht 66.0 in | Wt 152.0 lb

## 2015-09-08 DIAGNOSIS — Z86010 Personal history of colonic polyps: Secondary | ICD-10-CM | POA: Diagnosis not present

## 2015-09-08 MED ORDER — SODIUM CHLORIDE 0.9 % IV SOLN: 500.0000 mL | INTRAVENOUS | Status: DC

## 2015-09-08 NOTE — Patient Instructions (Addendum)
Normal today - with personal history of polyps and family history I suggest doing another in 5 years.  I appreciate the opportunity to care for you. Iva Booparl E. Savas Elvin, MD, FACG  YOU HAD AN ENDOSCOPIC PROCEDURE TODAY AT THE [facility]:   Refer to the procedure report that was given to you for any specific questions about what was found during the examination.  If the procedure report does not answer your questions, please call your gastroenterologist to clarify.  If you requested that your care partner not be given the details of your procedure findings, then the procedure report has been included in a sealed envelope for you to review at your convenience later.  YOU SHOULD EXPECT: Some feelings of bloating in the abdomen. Passage of more gas than usual.  Walking can help get rid of the air that was put into your GI tract during the procedure and reduce the bloating. If you had a lower endoscopy (such as a colonoscopy or flexible sigmoidoscopy) you may notice spotting of blood in your stool or on the toilet paper. If you underwent a bowel prep for your procedure, you may not have a normal bowel movement for a few days.  Please Note:  You might notice some irritation and congestion in your nose or some drainage.  This is from the oxygen used during your procedure.  There is no need for concern and it should clear up in a day or so.  SYMPTOMS TO REPORT IMMEDIATELY:   Following lower endoscopy (colonoscopy or flexible sigmoidoscopy):  Excessive amounts of blood in the stool  Significant tenderness or worsening of abdominal pains  Swelling of the abdomen that is new, acute  Fever of 100F or higher  For urgent or emergent issues, a gastroenterologist can be reached at any hour by calling [phone].   DIET: Your first meal following the procedure should be a small meal and then it is ok to progress to your normal diet. Heavy or fried foods are harder to digest and may make  you feel nauseous or bloated.  Likewise, meals heavy in dairy and vegetables can increase bloating.  Drink plenty of fluids but you should avoid alcoholic beverages for 24 hours.  ACTIVITY:  You should plan to take it easy for the rest of today and you should NOT DRIVE or use heavy machinery until tomorrow (because of the sedation medicines used during the test).    FOLLOW UP: Our staff will call the number listed on your records the next business day following your procedure to check on you and address any questions or concerns that you may have regarding the information given to you following your procedure. If we do not reach you, we will leave a message.  However, if you are feeling well and you are not experiencing any problems, there is no need to return our call.  We will assume that you have returned to your regular daily activities without incident.  If any biopsies were taken you will be contacted by phone or by letter within the next 1-3 weeks.  Please call us at [phone] if you have not heard about the biopsies in 3 weeks.    SIGNATURES/CONFIDENTIALITY: You and/or your care partner have signed paperwork which will be entered into your electronic medical record.  These signatures attest to the fact that that the information above on your After Visit Summary has been reviewed and is understood.  Full responsibility of the confidentiality of this  discharge information lies with you and/or your care-partner.

## 2015-09-08 NOTE — Op Note (Signed)
[facility] Patient Name: Amanda Parrish Procedure Date: 09/08/2015 8:37 AM MRN: [id number] Endoscopist: Iva Boop , MD Age: 50 Date of Birth: 1966-01-12 Gender: Female Procedure:                Colonoscopy Indications:              High risk colon cancer surveillance: Personal                            history of colonic polyps Medicines:                Propofol per Anesthesia, Monitored Anesthesia Care Procedure:                Pre-Anesthesia Assessment:                           - Prior to the procedure, a History and Physical                            was performed, and patient medications and                            allergies were reviewed. The patient's tolerance of                            previous anesthesia was also reviewed. The risks                            and benefits of the procedure and the sedation                            options and risks were discussed with the patient.                            All questions were answered, and informed consent                            was obtained. Prior Anticoagulants: The patient has                            taken no previous anticoagulant or antiplatelet                            agents. ASA Grade Assessment: II - A patient with                            mild systemic disease. After reviewing the risks                            and benefits, the patient was deemed in                            satisfactory condition to undergo the procedure.  After obtaining informed consent, the colonoscope                            was passed under direct vision. Throughout the                            procedure, the patient's blood pressure, pulse, and                            oxygen saturations were monitored continuously. The                            Model CF-HQ190L [id number]) scope was introduced                            through the anus and advanced to the the cecum,                             identified by appendiceal orifice and ileocecal                            valve. The quality of the bowel preparation was                            excellent. The colonoscopy was performed without                            difficulty. The patient tolerated the procedure                            well. The bowel preparation used was Miralax. The                            ileocecal valve, appendiceal orifice, and rectum                            were photographed. Scope In: 8:51:38 AM Scope Out: 9:08:35 AM Scope Withdrawal Time: 0 hours 9 minutes 45 seconds  Total Procedure Duration: 0 hours 16 minutes 57 seconds  Findings:                 The colon (entire examined portion) appeared normal.                           The perianal and digital rectal examinations were                            normal. Complications:            No immediate complications. Estimated blood loss:                            None. Estimated Blood Loss:     Estimated blood loss: none. Impression:               - The entire examined colon is normal.                           -  No specimens collected. Recommendation:           - Repeat colonoscopy in 5 years for surveillance.                            She has hx 2 adenomas 2008 and great aunt w/ CRCA                            (maternal) and miom and maternal aunt w/ polyps                           - Patient has a contact number available for                            emergencies. The signs and symptoms of potential                            delayed complications were discussed with the                            patient. Return to normal activities tomorrow.                            Written discharge instructions were provided to the                            patient.                           - Resume previous diet.                           - Continue present medications. Iva Booparl E Eilleen Davoli, MD 09/08/2015 9:26:55 AM This  report has been signed electronically.

## 2015-09-08 NOTE — Progress Notes (Signed)
A and Ox 3 Report to RN 

## 2015-09-09 ENCOUNTER — Telehealth: Payer: Self-pay | Admitting: *Deleted

## 2015-09-09 NOTE — Telephone Encounter (Signed)
No answer, left message to call if questions or concerns. 

## 2016-03-27 ENCOUNTER — Encounter: Payer: Self-pay | Admitting: Family Medicine

## 2016-03-27 ENCOUNTER — Ambulatory Visit (INDEPENDENT_AMBULATORY_CARE_PROVIDER_SITE_OTHER): Payer: BC Managed Care – PPO | Admitting: Family Medicine

## 2016-03-27 VITALS — BP 110/68 | HR 54 | Temp 98.5°F | Wt 153.5 lb

## 2016-03-27 DIAGNOSIS — F411 Generalized anxiety disorder: Secondary | ICD-10-CM

## 2016-03-27 DIAGNOSIS — E559 Vitamin D deficiency, unspecified: Secondary | ICD-10-CM | POA: Diagnosis not present

## 2016-03-27 DIAGNOSIS — E785 Hyperlipidemia, unspecified: Secondary | ICD-10-CM | POA: Diagnosis not present

## 2016-03-27 LAB — LIPID PANEL
CHOL/HDL RATIO: 5
Cholesterol: 255 mg/dL — ABNORMAL HIGH (ref 0–200)
HDL: 54.9 mg/dL (ref >39.00)
LDL Cholesterol: 186 mg/dL — ABNORMAL HIGH (ref 0–99)
NONHDL: 200.02
Triglycerides: 68 mg/dL (ref 0.0–149.0)
VLDL: 13.6 mg/dL (ref 0.0–40.0)

## 2016-03-27 LAB — COMPREHENSIVE METABOLIC PANEL
ALT: 12 U/L (ref 0–35)
AST: 16 U/L (ref 0–37)
Albumin: 4.5 g/dL (ref 3.5–5.2)
Alkaline Phosphatase: 41 U/L (ref 39–117)
BILIRUBIN TOTAL: 0.8 mg/dL (ref 0.2–1.2)
BUN: 13 mg/dL (ref 6–23)
CO2: 26 meq/L (ref 19–32)
CREATININE: 0.78 mg/dL (ref 0.40–1.20)
Calcium: 9.4 mg/dL (ref 8.4–10.5)
Chloride: 103 mEq/L (ref 96–112)
GFR: 83.34 mL/min (ref >60.00)
GLUCOSE: 87 mg/dL (ref 70–99)
Potassium: 4 mEq/L (ref 3.5–5.1)
Sodium: 136 mEq/L (ref 135–145)
Total Protein: 7.4 g/dL (ref 6.0–8.3)

## 2016-03-27 LAB — VITAMIN D 25 HYDROXY (VIT D DEFICIENCY, FRACTURES): VITD: 30.74 ng/mL (ref 30.00–100.00)

## 2016-03-27 MED ORDER — ATORVASTATIN CALCIUM 20 MG PO TABS: 20.0000 mg | ORAL_TABLET | Freq: Every day | ORAL | 2 refills | Status: DC

## 2016-03-27 MED ORDER — ESCITALOPRAM OXALATE 20 MG PO TABS: 20.0000 mg | ORAL_TABLET | Freq: Every day | ORAL | 2 refills | Status: DC

## 2016-03-27 MED ORDER — CLONAZEPAM 1 MG PO TABS: 1.0000 mg | ORAL_TABLET | Freq: Every day | ORAL | 1 refills | Status: DC | PRN

## 2016-03-27 NOTE — Progress Notes (Signed)
Subjective:   Patient ID: Amanda Parrish, female    DOB: 07/16/1965, 50 y.o.   MRN: [id number]  Amanda Parrish is a pleasant 50 y.o. year old female who presents to clinic today with Follow-up  on 03/27/2016  HPI:   Anxiety- symptoms well controlled on current dose of lexapro.Takes very infrequent Klonipin for severe anxiety/panic.  HLD- strong FH of HLD. Has been on lipitor, pravachol (caused insomnia) and crestor.    I restarted her lipitor in 08/2014.    Admits to taking lipitor every other day to minimize myalgias.  Due for labs.  Lab Results  Component Value Date   CHOL 227 (H) 06/21/2015   HDL 56.10 06/21/2015   LDLCALC 154 (H) 06/21/2015   TRIG 85.0 06/21/2015   CHOLHDL 4 06/21/2015    Vit D deficiency- history of needing high dose Vit D replacement.  Lab Results  Component Value Date   TSH 3.14 06/21/2015   Lab Results  Component Value Date   ALT 16 06/21/2015   AST 20 06/21/2015   ALKPHOS 42 06/21/2015   BILITOT 0.9 06/21/2015   Lab Results  Component Value Date   NA 136 06/21/2015   K 4.3 06/21/2015   CL 104 06/21/2015   CO2 28 06/21/2015   Lab Results  Component Value Date   WBC 5.8 06/21/2015   HGB 14.1 06/21/2015   HCT 41.4 06/21/2015   MCV 91.1 06/21/2015   PLT 248.0 06/21/2015    Current Outpatient Prescriptions on File Prior to Visit  Medication Sig Dispense Refill  . BIOTIN PO Take by mouth.    . clonazePAM (KLONOPIN) 1 MG tablet Take 1 tablet (1 mg total) by mouth daily as needed for anxiety. 30 tablet 1   No current facility-administered medications on file prior to visit.     Allergies  Allergen Reactions  . Bupropion Palpitations    Past Medical History:  Diagnosis Date  . Allergy   . Anemia    more than 10 years ago  . Anxiety   . Arthritis    hands and feet and hips   . Depression   . Hyperlipidemia   . IBS (irritable bowel syndrome)   . Neuromuscular disorder (HCC)    hiatal hernia  . Personal history of  colonic polyps 08/08/2006  . Vitamin D deficiency     Past Surgical History:  Procedure Laterality Date  . COLONOSCOPY    . ENDOMETRIAL ABLATION  2007   Novsure  . POLYPECTOMY     TA 2008  . TOOTH EXTRACTION    . TUBAL LIGATION    . UPPER GASTROINTESTINAL ENDOSCOPY     stricture and had dilatation  . WISDOM TOOTH EXTRACTION      Family History  Problem Relation Age of Onset  . Heart disease Father   . Cancer Paternal Grandfather     Prostate  . Prostate cancer Paternal Grandfather   . Colon polyps Mother   . Colon cancer Maternal Aunt   . Colon cancer Other     great maternal aunt ( maternal GF sister )   . Rectal cancer Neg Hx   . Stomach cancer Neg Hx   . Breast cancer Paternal Aunt     Social History   Social History  . Marital status: Divorced    Spouse name: N/A  . Number of children: N/A  . Years of education: N/A   Occupational History  . Not on file.   Social History Main  Topics  . Smoking status: Former Games developermoker  . Smokeless tobacco: Never Used  . Alcohol use 0.0 oz/week     Comment: Social  . Drug use: No  . Sexual activity: Yes    Birth control/ protection: Surgical     Comment: BTL   Other Topics Concern  . Not on file   Social History Narrative  . No narrative on file   The PMH, PSH, Social History, Family History, Medications, and allergies have been reviewed in [facility], and have been updated if relevant.   Review of Systems  Constitutional: Negative for fatigue and unexpected weight change.  HENT: Negative.   Eyes: Negative.   Respiratory: Negative.   Cardiovascular: Negative.   Gastrointestinal: Negative.   Endocrine: Negative.   Genitourinary: Negative.   Musculoskeletal: Negative.   Skin: Negative.   Allergic/Immunologic: Negative.   Neurological: Negative.   Hematological: Negative.   Psychiatric/Behavioral: Negative.   All other systems reviewed and are negative.      Objective:    BP 110/68   Pulse (!) 54   Temp 98.5  F (36.9 C) (Oral)   Wt 153 lb 8 oz (69.6 kg)   SpO2 99%   BMI 24.78 kg/m   Wt Readings from Last 3 Encounters:  03/27/16 153 lb 8 oz (69.6 kg)  09/08/15 152 lb (68.9 kg)  08/25/15 152 lb (68.9 kg)     Physical Exam  Constitutional: She is oriented to person, place, and time. She appears well-developed and well-nourished. No distress.  HENT:  Head: Normocephalic and atraumatic.  Left Ear: External ear normal.  Eyes: Conjunctivae are normal.  Neck: Neck supple. No thyromegaly present.  Cardiovascular: Normal rate, regular rhythm and normal heart sounds.   Pulmonary/Chest: Effort normal and breath sounds normal.  Abdominal: Soft. Bowel sounds are normal. She exhibits no distension and no mass. There is no tenderness. There is no rebound and no guarding.  Musculoskeletal: Normal range of motion. She exhibits no edema or tenderness.  Neurological: She is alert and oriented to person, place, and time. No cranial nerve deficit.  Skin: Skin is warm and dry.  Psychiatric: She has a normal mood and affect. Her behavior is normal. Judgment and thought content normal.  Nursing note and vitals reviewed.         Assessment & Plan:   No diagnosis found. No Follow-up on file.

## 2016-03-27 NOTE — Assessment & Plan Note (Signed)
Well controlled on current of lexapro.

## 2016-03-27 NOTE — Progress Notes (Signed)
Pre visit review using our clinic review tool, if applicable. No additional management support is needed unless otherwise documented below in the visit note. 

## 2016-03-27 NOTE — Assessment & Plan Note (Signed)
Recheck Vit d.

## 2016-03-27 NOTE — Assessment & Plan Note (Signed)
No changes made to rxs today. Check labs today.

## 2016-03-27 NOTE — Patient Instructions (Signed)
Great to see you. Happy Holidays!  We will call you with your lab results and you can view them online. 

## 2016-03-29 ENCOUNTER — Telehealth: Payer: Self-pay | Admitting: Family Medicine

## 2016-03-29 NOTE — Telephone Encounter (Signed)
Pt called regarding lab. Please call [phone] Thanks

## 2016-05-14 ENCOUNTER — Encounter: Payer: Self-pay | Admitting: Primary Care

## 2016-05-14 ENCOUNTER — Ambulatory Visit (INDEPENDENT_AMBULATORY_CARE_PROVIDER_SITE_OTHER): Payer: BC Managed Care – PPO | Admitting: Primary Care

## 2016-05-14 VITALS — BP 118/84 | HR 54 | Temp 98.3°F | Ht 66.0 in | Wt 161.0 lb

## 2016-05-14 DIAGNOSIS — R102 Pelvic and perineal pain: Secondary | ICD-10-CM

## 2016-05-14 LAB — POC URINALSYSI DIPSTICK (AUTOMATED)
Bilirubin, UA: NEGATIVE
GLUCOSE UA: NEGATIVE
Ketones, UA: NEGATIVE
LEUKOCYTES UA: NEGATIVE
NITRITE UA: NEGATIVE
PH UA: 7.5
Protein, UA: NEGATIVE
RBC UA: NEGATIVE
Spec Grav, UA: 1.015
UROBILINOGEN UA: NEGATIVE

## 2016-05-14 NOTE — Progress Notes (Addendum)
Subjective:    Patient ID: Amanda Parrish, female    DOB: December 05, 1965, 51 y.o.   MRN: [id number]  HPI  Ms. Amanda Parrish is a 51 year old female who presents today with a chief complaint of pelvic pain. She also reports fatigue, nausea, dizziness, urinary frequency, vaginal itching, foul smelling urine. She's noticed pelvic discomfort intermittently for the past 2 months. She's been taking AZO and Monistat without improvement. She denies vaginal discharge, fevers, abdominal pain, vomiting.   Review of Systems  Constitutional: Positive for fatigue. Negative for chills and fever.  Gastrointestinal: Positive for nausea. Negative for abdominal pain and vomiting.  Genitourinary: Positive for pelvic pain. Negative for dysuria, flank pain, hematuria and vaginal discharge.       Past Medical History:  Diagnosis Date  . Allergy   . Anemia    more than 10 years ago  . Anxiety   . Arthritis    hands and feet and hips   . Depression   . Hyperlipidemia   . IBS (irritable bowel syndrome)   . Neuromuscular disorder (HCC)    hiatal hernia  . Personal history of colonic polyps 08/08/2006  . Vitamin D deficiency      Social History   Social History  . Marital status: Divorced    Spouse name: N/A  . Number of children: N/A  . Years of education: N/A   Occupational History  . Not on file.   Social History Main Topics  . Smoking status: Former Games developermoker  . Smokeless tobacco: Never Used  . Alcohol use 0.0 oz/week     Comment: Social  . Drug use: No  . Sexual activity: Yes    Birth control/ protection: Surgical     Comment: BTL   Other Topics Concern  . Not on file   Social History Narrative  . No narrative on file    Past Surgical History:  Procedure Laterality Date  . COLONOSCOPY    . ENDOMETRIAL ABLATION  2007   Novsure  . POLYPECTOMY     TA 2008  . TOOTH EXTRACTION    . TUBAL LIGATION    . UPPER GASTROINTESTINAL ENDOSCOPY     stricture and had dilatation  . WISDOM TOOTH  EXTRACTION      Family History  Problem Relation Age of Onset  . Heart disease Father   . Cancer Paternal Grandfather     Prostate  . Prostate cancer Paternal Grandfather   . Colon polyps Mother   . Colon cancer Maternal Aunt   . Colon cancer Other     great maternal aunt ( maternal GF sister )   . Rectal cancer Neg Hx   . Stomach cancer Neg Hx   . Breast cancer Paternal Aunt     Allergies  Allergen Reactions  . Bupropion Palpitations    Current Outpatient Prescriptions on File Prior to Visit  Medication Sig Dispense Refill  . atorvastatin (LIPITOR) 20 MG tablet Take 1 tablet (20 mg total) by mouth daily. 90 tablet 2  . BIOTIN PO Take by mouth.    . clonazePAM (KLONOPIN) 1 MG tablet Take 1 tablet (1 mg total) by mouth daily as needed for anxiety. 30 tablet 1  . escitalopram (LEXAPRO) 20 MG tablet Take 1 tablet (20 mg total) by mouth daily. 90 tablet 2   No current facility-administered medications on file prior to visit.     BP 118/84   Pulse (!) 54   Temp 98.3 F (36.8 C) (Oral)  Ht 5\' 6"  (1.676 m)   Wt 161 lb (73 kg)   SpO2 99%   BMI 25.99 kg/m    Objective:   Physical Exam  Constitutional: She appears well-nourished. She does not appear ill.  Neck: Neck supple.  Cardiovascular: Normal rate and regular rhythm.   Pulmonary/Chest: Effort normal and breath sounds normal.  Abdominal: Soft. There is tenderness in the suprapubic area. There is no CVA tenderness.  Genitourinary: There is no tenderness or lesion on the right labia. There is no tenderness or lesion on the left labia. Cervix exhibits discharge. Cervix exhibits no motion tenderness. Right adnexum displays no tenderness. Left adnexum displays no tenderness. No vaginal discharge found.  Genitourinary Comments: Thick, whitish discharge to cervix  Skin: Skin is warm and dry.          Assessment & Plan:  Pelvic Pain:  Also with vaginal itching, urinary frequency, nausea, fatigue x 2 months. Exam  today with evidence for possible UTI vs Vaginal infection. Consider ovarian cysts UA: Negative. Wet prep completed and is pending. Abdominal exam otherwise unremarkable. Does not appear acutely ill and is stable for outpatient treatment. Will await results.  Morrie Sheldon, NP

## 2016-05-14 NOTE — Addendum Note (Signed)
Addended by: Tawnya CrookSAMBATH, Donaldo Teegarden on: 05/14/2016 09:17 AM   Modules accepted: Orders

## 2016-05-14 NOTE — Progress Notes (Signed)
Pre visit review using our clinic review tool, if applicable. No additional management support is needed unless otherwise documented below in the visit note. 

## 2016-05-14 NOTE — Patient Instructions (Signed)
We will notify you of your results once received.  Refrain from intercourse until we get in touch with you.  It was a pleasure to see you today!

## 2016-05-15 LAB — WET PREP BY MOLECULAR PROBE
CANDIDA SPECIES: NEGATIVE
GARDNERELLA VAGINALIS: NEGATIVE
TRICHOMONAS VAG: NEGATIVE

## 2016-05-21 ENCOUNTER — Encounter: Payer: Self-pay | Admitting: *Deleted

## 2016-07-05 ENCOUNTER — Ambulatory Visit (INDEPENDENT_AMBULATORY_CARE_PROVIDER_SITE_OTHER): Payer: BC Managed Care – PPO | Admitting: Gynecology

## 2016-07-05 ENCOUNTER — Encounter: Payer: Self-pay | Admitting: Gynecology

## 2016-07-05 ENCOUNTER — Other Ambulatory Visit: Payer: Self-pay | Admitting: Gynecology

## 2016-07-05 VITALS — BP 120/72 | Ht 66.0 in | Wt 161.0 lb

## 2016-07-05 DIAGNOSIS — R350 Frequency of micturition: Secondary | ICD-10-CM

## 2016-07-05 DIAGNOSIS — Z01419 Encounter for gynecological examination (general) (routine) without abnormal findings: Secondary | ICD-10-CM

## 2016-07-05 DIAGNOSIS — N926 Irregular menstruation, unspecified: Secondary | ICD-10-CM

## 2016-07-05 DIAGNOSIS — R102 Pelvic and perineal pain unspecified side: Secondary | ICD-10-CM

## 2016-07-05 DIAGNOSIS — N939 Abnormal uterine and vaginal bleeding, unspecified: Secondary | ICD-10-CM

## 2016-07-05 LAB — URINALYSIS W MICROSCOPIC + REFLEX CULTURE
BILIRUBIN URINE: NEGATIVE
CASTS: NONE SEEN [LPF]
Crystals: NONE SEEN [HPF]
Glucose, UA: NEGATIVE
HGB URINE DIPSTICK: NEGATIVE
KETONES UR: NEGATIVE
Leukocytes, UA: NEGATIVE
Nitrite: NEGATIVE
PROTEIN: NEGATIVE
RBC / HPF: NONE SEEN RBC/HPF (ref <=2)
Specific Gravity, Urine: 1.015 (ref 1.001–1.035)
YEAST: NONE SEEN [HPF]
pH: 8.5 — ABNORMAL HIGH (ref 5.0–8.0)

## 2016-07-05 NOTE — Patient Instructions (Signed)
Schedule your mammogram.  Follow up for ultrasound as scheduled

## 2016-07-05 NOTE — Addendum Note (Signed)
Addended by: Berna SpareASTILLO, Waymond Meador A on: 07/05/2016 10:30 AM   Modules accepted: Orders

## 2016-07-05 NOTE — Progress Notes (Signed)
    Amanda Parrish [birth year] [id number]        51 y.o.  [id number] for annual exam.  Also complaining of several months worth of urinary frequency, urgency and strong odor to her urine. Was treated by her primary for UTI in the past. No fever or chills. No low back pain. Having some pelvic cramping and pressure symptoms. Several months ago had an episode of spotting. No bleeding prior to this for a number of years with history of NovaSure endometrial ablation 2007.  History of ultrasound 2016 which showed some small cystic changes in both ovaries.  Past medical history,surgical history, problem list, medications, allergies, family history and social history were all reviewed and documented as reviewed in the EPIC chart.  ROS:  Performed with pertinent positives and negatives included in the history, assessment and plan.   Additional significant findings :  None   Exam: Biomedical scientistBlanca assistant Vitals:   07/05/16 0839  BP: 120/72  Weight: 161 lb (73 kg)  Height: 5\' 6"  (1.676 m)   Body mass index is 25.99 kg/m.  General appearance:  Normal affect, orientation and appearance. Skin: Grossly normal HEENT: Without gross lesions.  No cervical or supraclavicular adenopathy. Thyroid normal.  Lungs:  Clear without wheezing, rales or rhonchi Cardiac: RR, without RMG Abdominal:  Soft, nontender, without masses, guarding, rebound, organomegaly or hernia Breasts:  Examined lying and sitting without masses, retractions, discharge or axillary adenopathy. Pelvic:  Ext, BUS, Vagina: Normal  Cervix: Normal  Uterus: Generous in size midline mobile nontender  Adnexa: Without masses or tenderness    Anus and perineum: Normal   Rectovaginal: Normal sphincter tone without palpated masses or tenderness.    Assessment/Plan:  51 y.o. [id number] female for annual exam.   1. Urinary frequency, urgency and strong urine odor. Urinalysis shows 0-5 WBC negative RBC 6-10 squamous cells few bacteria. Consistent with  contamination. Will check urine culture and treat if positive. If urinary symptoms continue and urine continues without bacterial growth will consider urology referral. 2. Pelvic cramping/episode spotting. Having some hot flushes that she says her Lexapro seems to help with. No vaginal dryness or significant night sweats. We'll check baseline FSH. Status post ablation in the past. History of ovarian cystic changes with last ultrasound 2016. Will schedule sonohysterogram now for endometrial assessment and pelvic surveillance. Difficulty with endometrial distention after ablations reviewed with her. Patient will follow up ultrasound we will go from there. 3. Mammography 2012. Most common cancer in women discussed. Need to schedule baseline mammogram now reviewed. Patient agrees to do so. SBE monthly reviewed. 4. Colonoscopy 2017. Repeat at their recommended interval. 5. Pap smear/HPV 2015. No Pap smear done today. No history of abnormal Pap smears previously. Plan repeat Pap smear approaching 5 year interval per current screening guidelines. 6. Health maintenance. No routine lab work done as patient does this through her primary physician's office who is following her for her elevated cholesterol. Follow up for ultrasound as scheduled. Schedule mammogram as encouraged. Follow up in one year for annual exam.  Additional time in excess of her routine gynecologic exam was spent in direct face to face counseling and coordination of care in regards to her urinary frequency and urgency as well as her pelvic cramping/irregular bleeding.    Dara LordsFONTAINE,Bralen Wiltgen P MD, 8:55 AM 07/05/2016

## 2016-07-06 LAB — URINE CULTURE: Organism ID, Bacteria: NO GROWTH

## 2016-07-06 LAB — FOLLICLE STIMULATING HORMONE: FSH: 7.2 m[IU]/mL

## 2016-07-16 ENCOUNTER — Ambulatory Visit (INDEPENDENT_AMBULATORY_CARE_PROVIDER_SITE_OTHER): Payer: BC Managed Care – PPO | Admitting: Gynecology

## 2016-07-16 ENCOUNTER — Ambulatory Visit (INDEPENDENT_AMBULATORY_CARE_PROVIDER_SITE_OTHER): Payer: BC Managed Care – PPO

## 2016-07-16 ENCOUNTER — Other Ambulatory Visit: Payer: Self-pay | Admitting: Gynecology

## 2016-07-16 VITALS — BP 124/82

## 2016-07-16 DIAGNOSIS — Z8742 Personal history of other diseases of the female genital tract: Secondary | ICD-10-CM

## 2016-07-16 DIAGNOSIS — R102 Pelvic and perineal pain unspecified side: Secondary | ICD-10-CM

## 2016-07-16 DIAGNOSIS — N939 Abnormal uterine and vaginal bleeding, unspecified: Secondary | ICD-10-CM | POA: Diagnosis not present

## 2016-07-16 DIAGNOSIS — N83202 Unspecified ovarian cyst, left side: Secondary | ICD-10-CM | POA: Diagnosis not present

## 2016-07-16 DIAGNOSIS — N926 Irregular menstruation, unspecified: Secondary | ICD-10-CM

## 2016-07-16 NOTE — Progress Notes (Signed)
    Amanda Parrish 1965-08-21 [id number]        51 y.o.  [id number] presents for sonohysterogram. History of spotting several months ago with some pelvic cramping on and off. Endometrial ablation in the past. No regular menses or any bleeding.   Past medical history,surgical history, problem list, medications, allergies, family history and social history were all reviewed and documented in the EPIC chart.  Directed ROS with pertinent positives and negatives documented in the history of present illness/assessment and plan.  Exam: [place] assistant Vitals:   07/16/16 1001  BP: 124/82   General appearance:  Normal Abdomen soft nontender without masses guarding rebound Pelvic external BUS vagina normal. Cervix normal. Uterus generous in size. Adnexa without masses or tenderness.  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 Endometrial echo 3.1 mm. Right ovary with follicle 15 x 15 mm. Left ovary with thick walled cyst with internal low-level echoes 19 x 20 mm, negative color flow consistent with hemorrhagic changes. Small follicle 15 x 15 mm. Negative cul-de-sac.  Sonohysterogram performed, sterile technique, single-tooth tenaculum anterior lip stabilization with filling of the lower endometrial canal with no defects noted. Catheter unable to advance past lower endometrium. Mid to fundal region would not expand consistent with her history of endometrial ablation. Endometrial sample without return of tissue and not sent to pathology.  Assessment/Plan:  51 y.o. [id number] with history of spotting 1 episode several months ago. Endometrial echo thin at 3.1 mm. Cavity unable to expand due to history of ablation in the past.  Small physiologic changes both ovaries. No tissue retrieved on specimen and nothing sent to pathology. Reviewed with patient and recommended observation for now. If no further bleeding then will monitor. Recent FSH 7. If  continued bleeding then will represent for evaluation. Was having some cramping. No evidence of hematometria or ovarian pathology. Will also monitor and follow up if persists, changes or worsens.    Dara Lords MD, 10:08 AM 07/16/2016

## 2016-07-16 NOTE — Patient Instructions (Signed)
Follow-up if any further bleeding. 

## 2016-09-05 ENCOUNTER — Encounter: Payer: Self-pay | Admitting: Family Medicine

## 2016-09-05 ENCOUNTER — Ambulatory Visit (INDEPENDENT_AMBULATORY_CARE_PROVIDER_SITE_OTHER): Payer: BC Managed Care – PPO | Admitting: Family Medicine

## 2016-09-05 VITALS — BP 118/84 | HR 87 | Temp 99.8°F | Wt 159.0 lb

## 2016-09-05 DIAGNOSIS — J020 Streptococcal pharyngitis: Secondary | ICD-10-CM | POA: Diagnosis not present

## 2016-09-05 LAB — POCT RAPID STREP A (OFFICE): Rapid Strep A Screen: NEGATIVE

## 2016-09-05 MED ORDER — AMOXICILLIN 875 MG PO TABS: 875.0000 mg | ORAL_TABLET | Freq: Two times a day (BID) | ORAL | 0 refills | Status: DC

## 2016-09-05 MED ORDER — ONDANSETRON HCL 4 MG PO TABS: 4.0000 mg | ORAL_TABLET | Freq: Three times a day (TID) | ORAL | 0 refills | Status: DC | PRN

## 2016-09-05 NOTE — Progress Notes (Signed)
Pre visit review using our clinic review tool, if applicable. No additional management support is needed unless otherwise documented below in the visit note. 

## 2016-09-05 NOTE — Patient Instructions (Signed)

## 2016-09-05 NOTE — Addendum Note (Signed)
Addended by: Eual FinesBRIDGES, Niguel Moure P on: 09/05/2016 12:47 PM   Modules accepted: Orders

## 2016-09-05 NOTE — Progress Notes (Signed)
SUBJECTIVE: 51 y.o. female with sore throat, myalgias, swollen glands, headache and fever for 5 days. No history of rheumatic fever. Other symptoms: swollen glands.  Current Outpatient Prescriptions on File Prior to Visit  Medication Sig Dispense Refill  . atorvastatin (LIPITOR) 20 MG tablet Take 1 tablet (20 mg total) by mouth daily. 90 tablet 2  . BIOTIN PO Take by mouth.    . clonazePAM (KLONOPIN) 1 MG tablet Take 1 tablet (1 mg total) by mouth daily as needed for anxiety. 30 tablet 1  . escitalopram (LEXAPRO) 20 MG tablet Take 1 tablet (20 mg total) by mouth daily. 90 tablet 2   No current facility-administered medications on file prior to visit.     Allergies  Allergen Reactions  . Bupropion Palpitations    Past Medical History:  Diagnosis Date  . Allergy   . Anemia    more than 10 years ago  . Anxiety   . Arthritis    hands and feet and hips   . Depression   . Hyperlipidemia   . IBS (irritable bowel syndrome)   . Neuromuscular disorder (HCC)    hiatal hernia  . Personal history of colonic polyps 08/08/2006  . Vitamin D deficiency     Past Surgical History:  Procedure Laterality Date  . COLONOSCOPY    . ENDOMETRIAL ABLATION  2007   Novsure  . POLYPECTOMY     TA 2008  . TOOTH EXTRACTION    . TUBAL LIGATION    . UPPER GASTROINTESTINAL ENDOSCOPY     stricture and had dilatation  . WISDOM TOOTH EXTRACTION      Family History  Problem Relation Age of Onset  . Heart disease Father   . Colon polyps Mother   . Cancer Paternal Grandfather     Prostate  . Prostate cancer Paternal Grandfather   . Colon cancer Maternal Aunt   . Colon cancer Other     great maternal aunt ( maternal GF sister )   . Breast cancer Paternal Aunt   . Rectal cancer Neg Hx   . Stomach cancer Neg Hx     Social History   Social History  . Marital status: Divorced    Spouse name: N/A  . Number of children: N/A  . Years of education: N/A   Occupational History  . Not on file.    Social History Main Topics  . Smoking status: Former Games developermoker  . Smokeless tobacco: Never Used  . Alcohol use 0.0 oz/week     Comment: Social  . Drug use: No  . Sexual activity: Yes    Birth control/ protection: Surgical     Comment: BTL   Other Topics Concern  . Not on file   Social History Narrative  . No narrative on file   The PMH, PSH, Social History, Family History, Medications, and allergies have been reviewed in [facility], and have been updated if relevant.  OBJECTIVE:  BP 118/84 (BP Location: Left Arm, Patient Position: Sitting, Cuff Size: Normal)   Pulse 87   Temp 99.8 F (37.7 C) (Oral)   Wt 159 lb (72.1 kg)   SpO2 97%   BMI 25.66 kg/m   Vitals as noted above. Appears alert, well appearing, and in no distress. Ears: bilateral TM's and external ear canals normal Oropharynx: tonsils hypertrophied with exudate Neck: supple, no significant adenopathy Lungs: clear to auscultation, no wheezes, rales or rhonchi, symmetric air entry Rapid Strep test is negative  ASSESSMENT: Streptococcal pharyngitis- rapid strep neg  but has classic strep findings on exam and history.  PLAN: Per orders. Gargle, use acetaminophen or other OTC analgesic, and take Rx fully as prescribed. Call if other family members develop similar symptoms. See prn.

## 2016-12-12 ENCOUNTER — Encounter: Payer: Self-pay | Admitting: Family Medicine

## 2016-12-12 ENCOUNTER — Ambulatory Visit (INDEPENDENT_AMBULATORY_CARE_PROVIDER_SITE_OTHER): Payer: BC Managed Care – PPO | Admitting: Family Medicine

## 2016-12-12 VITALS — BP 116/80 | HR 62 | Temp 98.7°F

## 2016-12-12 DIAGNOSIS — J029 Acute pharyngitis, unspecified: Secondary | ICD-10-CM | POA: Diagnosis not present

## 2016-12-12 MED ORDER — AMOXICILLIN 875 MG PO TABS
875.0000 mg | ORAL_TABLET | Freq: Two times a day (BID) | ORAL | 0 refills | Status: DC
Start: 2016-12-12 — End: 2017-04-19

## 2016-12-12 NOTE — Progress Notes (Signed)
SUBJECTIVE:  Amanda Parrish is a 51 y.o. female who complains of coryza, congestion, sneezing, sore throat, swollen glands and dry cough for 4 days. She denies a history of anorexia and chest pain and denies a history of asthma. Patient denies smoke cigarettes.   Current Outpatient Prescriptions on File Prior to Visit  Medication Sig Dispense Refill  . atorvastatin (LIPITOR) 20 MG tablet Take 1 tablet (20 mg total) by mouth daily. 90 tablet 2  . BIOTIN PO Take by mouth.    . clonazePAM (KLONOPIN) 1 MG tablet Take 1 tablet (1 mg total) by mouth daily as needed for anxiety. 30 tablet 1  . escitalopram (LEXAPRO) 20 MG tablet Take 1 tablet (20 mg total) by mouth daily. 90 tablet 2   No current facility-administered medications on file prior to visit.     Allergies  Allergen Reactions  . Bupropion Palpitations    Past Medical History:  Diagnosis Date  . Allergy   . Anemia    more than 10 years ago  . Anxiety   . Arthritis    hands and feet and hips   . Depression   . Hyperlipidemia   . IBS (irritable bowel syndrome)   . Neuromuscular disorder (HCC)    hiatal hernia  . Personal history of colonic polyps 08/08/2006  . Vitamin D deficiency     Past Surgical History:  Procedure Laterality Date  . COLONOSCOPY    . ENDOMETRIAL ABLATION  2007   Novsure  . POLYPECTOMY     TA 2008  . TOOTH EXTRACTION    . TUBAL LIGATION    . UPPER GASTROINTESTINAL ENDOSCOPY     stricture and had dilatation  . WISDOM TOOTH EXTRACTION      Family History  Problem Relation Age of Onset  . Heart disease Father   . Colon polyps Mother   . Cancer Paternal Grandfather        Prostate  . Prostate cancer Paternal Grandfather   . Colon cancer Maternal Aunt   . Colon cancer Other        great maternal aunt ( maternal GF sister )   . Breast cancer Paternal Aunt   . Rectal cancer Neg Hx   . Stomach cancer Neg Hx     Social History   Social History  . Marital status: Divorced    Spouse name:  N/A  . Number of children: N/A  . Years of education: N/A   Occupational History  . Not on file.   Social History Main Topics  . Smoking status: Former Games developermoker  . Smokeless tobacco: Never Used  . Alcohol use 0.0 oz/week     Comment: Social  . Drug use: No  . Sexual activity: Yes    Birth control/ protection: Surgical     Comment: BTL   Other Topics Concern  . Not on file   Social History Narrative  . No narrative on file   The PMH, PSH, Social History, Family History, Medications, and allergies have been reviewed in [facility], and have been updated if relevant.  OBJECTIVE:  BP 116/80   Pulse 62   Temp 98.7 F (37.1 C)   SpO2 97%   She appears well, vital signs are as noted. Ears normal.  Enlarged tonsils with white exudate  Neck supple. No adenopathy in the neck. Nose is congested. Sinuses non tender. The chest is clear, without wheezes or rales.  ASSESSMENT:  Strep pharyngitis  PLAN: Augmentin twice daily x 10  days. Symptomatic therapy suggested: push fluids, rest and return office visit prn if symptoms persist or worsen.Call or return to clinic prn if these symptoms worsen or fail to improve as anticipated.

## 2016-12-12 NOTE — Patient Instructions (Signed)

## 2016-12-13 LAB — POCT RAPID STREP A (OFFICE): RAPID STREP A SCREEN: NEGATIVE

## 2016-12-24 ENCOUNTER — Ambulatory Visit (INDEPENDENT_AMBULATORY_CARE_PROVIDER_SITE_OTHER): Payer: BC Managed Care – PPO | Admitting: Family Medicine

## 2016-12-24 VITALS — BP 120/82 | HR 75 | Temp 98.3°F | Resp 18 | Wt 169.0 lb

## 2016-12-24 DIAGNOSIS — F411 Generalized anxiety disorder: Secondary | ICD-10-CM | POA: Diagnosis not present

## 2016-12-24 DIAGNOSIS — F321 Major depressive disorder, single episode, moderate: Secondary | ICD-10-CM

## 2016-12-24 MED ORDER — BUPROPION HCL ER (XL) 150 MG PO TB24: 150.0000 mg | ORAL_TABLET | Freq: Every day | ORAL | 3 refills | Status: DC

## 2016-12-24 NOTE — Patient Instructions (Signed)
Great to see you. Please start wellbutrin xl 150 mg every morning.

## 2016-12-24 NOTE — Progress Notes (Signed)
Subjective:   Patient ID: Amanda Parrish, female    DOB: 1966-02-19, 51 y.o.   MRN: [id number]  Amanda Parrish is a pleasant 51 y.o. year old female who presents to clinic today with Follow-up (anxiety)  on 12/24/2016  HPI:  Anxiety- symptoms well controlled on current dose of lexapro.Takes very infrequent Klonipin for severe anxiety/panic.  But now having more symptoms of anhedonia and tearfulness.  Recently lost her dad.  Sleeping okay. Appetite good- Wt Readings from Last 3 Encounters:  12/24/16 169 lb (76.7 kg)  09/05/16 159 lb (72.1 kg)  07/05/16 161 lb (73 kg)    No SI or HI.  Current Outpatient Prescriptions on File Prior to Visit  Medication Sig Dispense Refill  . amoxicillin (AMOXIL) 875 MG tablet Take 1 tablet (875 mg total) by mouth 2 (two) times daily. 20 tablet 0  . atorvastatin (LIPITOR) 20 MG tablet Take 1 tablet (20 mg total) by mouth daily. 90 tablet 2  . BIOTIN PO Take by mouth.    . clonazePAM (KLONOPIN) 1 MG tablet Take 1 tablet (1 mg total) by mouth daily as needed for anxiety. 30 tablet 1  . escitalopram (LEXAPRO) 20 MG tablet Take 1 tablet (20 mg total) by mouth daily. 90 tablet 2   No current facility-administered medications on file prior to visit.     No Known Allergies  Past Medical History:  Diagnosis Date  . Allergy   . Anemia    more than 10 years ago  . Anxiety   . Arthritis    hands and feet and hips   . Depression   . Hyperlipidemia   . IBS (irritable bowel syndrome)   . Neuromuscular disorder (HCC)    hiatal hernia  . Personal history of colonic polyps 08/08/2006  . Vitamin D deficiency     Past Surgical History:  Procedure Laterality Date  . COLONOSCOPY    . ENDOMETRIAL ABLATION  2007   [facility]sure  . POLYPECTOMY     TA 2008  . TOOTH EXTRACTION    . TUBAL LIGATION    . UPPER GASTROINTESTINAL ENDOSCOPY     stricture and had dilatation  . WISDOM TOOTH EXTRACTION      Family History  Problem Relation Age of Onset    . Heart disease Father   . Colon polyps Mother   . Cancer Paternal Grandfather        Prostate  . Prostate cancer Paternal Grandfather   . Colon cancer Maternal Aunt   . Colon cancer Other        great maternal aunt ( maternal GF sister )   . Breast cancer Paternal Aunt   . Rectal cancer Neg Hx   . Stomach cancer Neg Hx     Social History   Social History  . Marital status: Divorced    Spouse name: N/A  . Number of children: N/A  . Years of education: N/A   Occupational History  . Not on file.   Social History Main Topics  . Smoking status: Former Games developer  . Smokeless tobacco: Never Used  . Alcohol use 0.0 oz/week     Comment: Social  . Drug use: No  . Sexual activity: Yes    Birth control/ protection: Surgical     Comment: BTL   Other Topics Concern  . Not on file   Social History Narrative  . No narrative on file   The PMH, PSH, Social History, Family History, Medications, and allergies  have been reviewed in [facility], and have been updated if relevant.  Review of Systems  Psychiatric/Behavioral: Positive for dysphoric mood. Negative for behavioral problems, confusion, decreased concentration, hallucinations, self-injury and sleep disturbance. The patient is not nervous/anxious and is not hyperactive.        Objective:    BP 120/82   Pulse 75   Temp 98.3 F (36.8 C)   Resp 18   Wt 169 lb (76.7 kg)   SpO2 97%   BMI 27.28 kg/m    Physical Exam   General:  Well-developed,well-nourished,in no acute distress; alert,appropriate and cooperative throughout examination Head:  normocephalic and atraumatic.   Eyes:  vision grossly intact, PERRL Ears:  R ear normal and L ear normal externally, TMs clear bilaterally Nose:  no external deformity.   Mouth:  good dentition.   Neck:  No deformities, masses, or tenderness noted. Lungs:  Normal respiratory effort, chest expands symmetrically. Lungs are clear to auscultation, no crackles or wheezes. Heart:  Normal rate  and regular rhythm. S1 and S2 normal without gallop, murmur, click, rub or other extra sounds. Msk:  No deformity or scoliosis noted of thoracic or lumbar spine.   Extremities:  No clubbing, cyanosis, edema, or deformity noted with normal full range of motion of all joints.   Neurologic:  alert & oriented X3 and gait normal.   Skin:  Intact without suspicious lesions or rashes Psych:  Cognition and judgment appear intact. Alert and cooperative with normal attention span and concentration. No apparent delusions, illusions, hallucinations       Assessment & Plan:   Generalized anxiety disorder No Follow-up on file.

## 2016-12-24 NOTE — Assessment & Plan Note (Signed)
New- likely more situational but has been ongoing for months. >25 minutes spent in face to face time with patient, >50% spent in counselling or coordination of care Anxiety has been well controlled on lexapro.  She would prefer to add wellbutrin.  eRx sent for Wellbutrin 150 mg XL daily. She will update me in a few weeks. The patient indicates understanding of these issues and agrees with the plan.

## 2017-04-19 ENCOUNTER — Ambulatory Visit: Payer: BC Managed Care – PPO | Admitting: Family Medicine

## 2017-04-19 ENCOUNTER — Encounter: Payer: Self-pay | Admitting: *Deleted

## 2017-04-19 ENCOUNTER — Other Ambulatory Visit: Payer: Self-pay

## 2017-04-19 ENCOUNTER — Ambulatory Visit: Payer: BC Managed Care – PPO | Admitting: Internal Medicine

## 2017-04-19 ENCOUNTER — Encounter: Payer: Self-pay | Admitting: Family Medicine

## 2017-04-19 VITALS — BP 112/70 | HR 70 | Temp 98.5°F | Ht 66.0 in | Wt 156.5 lb

## 2017-04-19 DIAGNOSIS — J029 Acute pharyngitis, unspecified: Secondary | ICD-10-CM | POA: Diagnosis not present

## 2017-04-19 DIAGNOSIS — R3915 Urgency of urination: Secondary | ICD-10-CM

## 2017-04-19 LAB — POCT RAPID STREP A (OFFICE): RAPID STREP A SCREEN: NEGATIVE

## 2017-04-19 LAB — POC URINALSYSI DIPSTICK (AUTOMATED)
BILIRUBIN UA: NEGATIVE
Glucose, UA: NEGATIVE
Ketones, UA: NEGATIVE
Leukocytes, UA: NEGATIVE
NITRITE UA: NEGATIVE
PH UA: 6.5 (ref 5.0–8.0)
Protein, UA: NEGATIVE
RBC UA: NEGATIVE
Spec Grav, UA: 1.015 (ref 1.010–1.025)
UROBILINOGEN UA: 0.2 U/dL

## 2017-04-19 NOTE — Assessment & Plan Note (Signed)
Uncelear cause of symptoms.  neg Strep. Eval for mono and with cbc. If negative consider referral for eval of oropharyngeal changes by ENT.

## 2017-04-19 NOTE — Assessment & Plan Note (Signed)
No clear UTI on UA.  Increase fluids. eval with labs.

## 2017-04-19 NOTE — Progress Notes (Signed)
   Subjective:    Patient ID: Amanda Parrish, female    DOB: 03-10-1966, 51 y.o.   MRN: [id number]  Urinary Tract Infection   This is a new problem. The current episode started 1 to 4 weeks ago. The problem has been waxing and waning. The patient is experiencing no pain. There has been no fever. Associated symptoms include chills, flank pain, nausea and urgency. Pertinent negatives include no frequency, hesitancy or vomiting. Associated symptoms comments: Urine odor  low back pain off and on   holds urine a lot   fatigue, body aches.   Intermittent nausea x  Several week,, once last week one episode of emesis  She has also noted white spot on her throat...  Earlier in the week, intermittent right neck soreness and ear pain   Feeling fatigued.  Neg strep test in 11/2016 Treated with Augmentin in 11/2016   Social History /Family History/Past Medical History reviewed in detail and updated in EMR if needed. Blood pressure 112/70, pulse 70, temperature 98.5 F (36.9 C), temperature source Oral, height 5\' 6"  (1.676 m), weight 156 lb 8 oz (71 kg).   Review of Systems  Constitutional: Positive for chills.  Gastrointestinal: Positive for nausea. Negative for vomiting.  Genitourinary: Positive for flank pain and urgency. Negative for frequency and hesitancy.       Objective:   Physical Exam  Constitutional: Vital signs are normal. She appears well-developed and well-nourished. She is cooperative.  Non-toxic appearance. She has a sickly appearance. She does not appear ill. No distress.   Fatigued appearing  HENT:  Head: Normocephalic.  Right Ear: Hearing, tympanic membrane, external ear and ear canal normal. Tympanic membrane is not erythematous, not retracted and not bulging.  Left Ear: Hearing, tympanic membrane, external ear and ear canal normal. Tympanic membrane is not erythematous, not retracted and not bulging.  Nose: No mucosal edema or rhinorrhea. Right sinus exhibits no  maxillary sinus tenderness and no frontal sinus tenderness. Left sinus exhibits no maxillary sinus tenderness and no frontal sinus tenderness.  Mouth/Throat: Uvula is midline, oropharynx is clear and moist and mucous membranes are normal. No oropharyngeal exudate, posterior oropharyngeal edema or posterior oropharyngeal erythema.  White cyst in right oropharynx  Eyes: Conjunctivae, EOM and lids are normal. Pupils are equal, round, and reactive to light. Lids are everted and swept, no foreign bodies found.  Neck: Trachea normal and normal range of motion. Neck supple. Carotid bruit is not present. No thyroid mass and no thyromegaly present.  Cardiovascular: Normal rate, regular rhythm, S1 normal, S2 normal, normal heart sounds, intact distal pulses and normal pulses. Exam reveals no gallop and no friction rub.  No murmur heard. Pulmonary/Chest: Effort normal and breath sounds normal. No tachypnea. No respiratory distress. She has no decreased breath sounds. She has no wheezes. She has no rhonchi. She has no rales.  Abdominal: Soft. Normal appearance and bowel sounds are normal. There is no tenderness.  Neurological: She is alert.  Skin: Skin is warm, dry and intact. No rash noted.  Psychiatric: Her speech is normal and behavior is normal. Judgment and thought content normal. Her mood appears not anxious. Cognition and memory are normal. She does not exhibit a depressed mood.          Assessment & Plan:

## 2017-04-19 NOTE — Patient Instructions (Addendum)
Please stop at the lab to have labs drawn.  

## 2017-04-20 ENCOUNTER — Other Ambulatory Visit: Payer: Self-pay | Admitting: Family Medicine

## 2017-04-22 LAB — CBC WITH DIFFERENTIAL/PLATELET
BASOS PCT: 0.8 %
Basophils Absolute: 50 cells/uL (ref 0–200)
EOS PCT: 1.3 %
Eosinophils Absolute: 81 cells/uL (ref 15–500)
HCT: 38.7 % (ref 35.0–45.0)
HEMOGLOBIN: 13.6 g/dL (ref 11.7–15.5)
Lymphs Abs: 1947 cells/uL (ref 850–3900)
MCH: 31.4 pg (ref 27.0–33.0)
MCHC: 35.1 g/dL (ref 32.0–36.0)
MCV: 89.4 fL (ref 80.0–100.0)
MONOS PCT: 6.6 %
MPV: 10.3 fL (ref 7.5–12.5)
NEUTROS ABS: 3714 {cells}/uL (ref 1500–7800)
Neutrophils Relative %: 59.9 %
PLATELETS: 248 10*3/uL (ref 140–400)
RBC: 4.33 10*6/uL (ref 3.80–5.10)
RDW: 12 % (ref 11.0–15.0)
TOTAL LYMPHOCYTE: 31.4 %
WBC mixed population: 409 cells/uL (ref 200–950)
WBC: 6.2 10*3/uL (ref 3.8–10.8)

## 2017-04-22 LAB — COMPREHENSIVE METABOLIC PANEL
AG RATIO: 1.6 (calc) (ref 1.0–2.5)
ALT: 10 U/L (ref 6–29)
AST: 17 U/L (ref 10–35)
Albumin: 4.5 g/dL (ref 3.6–5.1)
Alkaline phosphatase (APISO): 52 U/L (ref 33–130)
BILIRUBIN TOTAL: 0.7 mg/dL (ref 0.2–1.2)
BUN: 10 mg/dL (ref 7–25)
CHLORIDE: 105 mmol/L (ref 98–110)
CO2: 19 mmol/L — AB (ref 20–32)
Calcium: 9.6 mg/dL (ref 8.6–10.4)
Creat: 0.96 mg/dL (ref 0.50–1.05)
GLOBULIN: 2.9 g/dL (ref 1.9–3.7)
GLUCOSE: 89 mg/dL (ref 65–99)
Potassium: 4.5 mmol/L (ref 3.5–5.3)
SODIUM: 145 mmol/L (ref 135–146)
TOTAL PROTEIN: 7.4 g/dL (ref 6.1–8.1)

## 2017-04-22 LAB — MONONUCLEOSIS SCREEN: HETEROPHILE, MONO SCREEN: NEGATIVE

## 2017-05-13 ENCOUNTER — Other Ambulatory Visit: Payer: Self-pay | Admitting: Family Medicine

## 2017-05-20 ENCOUNTER — Ambulatory Visit: Payer: BC Managed Care – PPO | Admitting: Internal Medicine

## 2017-05-20 ENCOUNTER — Encounter: Payer: Self-pay | Admitting: Internal Medicine

## 2017-05-20 DIAGNOSIS — K581 Irritable bowel syndrome with constipation: Secondary | ICD-10-CM

## 2017-05-20 DIAGNOSIS — K589 Irritable bowel syndrome without diarrhea: Secondary | ICD-10-CM | POA: Insufficient documentation

## 2017-05-20 DIAGNOSIS — F419 Anxiety disorder, unspecified: Secondary | ICD-10-CM

## 2017-05-20 DIAGNOSIS — F329 Major depressive disorder, single episode, unspecified: Secondary | ICD-10-CM | POA: Diagnosis not present

## 2017-05-20 DIAGNOSIS — E7800 Pure hypercholesterolemia, unspecified: Secondary | ICD-10-CM

## 2017-05-20 DIAGNOSIS — J302 Other seasonal allergic rhinitis: Secondary | ICD-10-CM

## 2017-05-20 MED ORDER — ESCITALOPRAM OXALATE 20 MG PO TABS: 20.0000 mg | ORAL_TABLET | Freq: Every day | ORAL | 1 refills | Status: DC

## 2017-05-20 MED ORDER — BUPROPION HCL ER (XL) 150 MG PO TB24: 150.0000 mg | ORAL_TABLET | Freq: Every day | ORAL | 1 refills | Status: DC

## 2017-05-20 NOTE — Progress Notes (Signed)
HPI  Pt presents to the clinic today to establish care and for management of the conditions listed below. Amanda Parrish is transferring care from Dr. Dayton MartesAron.  Seasonal Allergies: Worse in the spring. Amanda Parrish will take Flonase OTC as needed with good relief.  Anxiety and Depression: Amanda Parrish is taking Lexapro and Wellbutrin as prescribed. Amanda Parrish rarely ever uses the Clonazepam.  HLD: Her last LDL was 186, 02/2016. Amanda Parrish is taking Atorvastatin as prescribed. Amanda Parrish does not consume a low fat diet.  IBS: Mainly constipation. Amanda Parrish consumes a high fiber diet and Amanda Parrish takes Mirilax as needed.   Flu: 01/2017 Tetanus: 09/2009 Pap Smear: 2018, [facility] Mammogram: 2017, [facility] Colon Screening: 08/2015 Vision Screening: annually Dentist: biannually  Past Medical History:  Diagnosis Date  . Allergy   . Anemia    more than 10 years ago  . Anxiety   . Arthritis    hands and feet and hips   . Depression   . Hyperlipidemia   . IBS (irritable bowel syndrome)   . Neuromuscular disorder (HCC)    hiatal hernia  . Personal history of colonic polyps 08/08/2006  . Vitamin D deficiency     Current Outpatient Medications  Medication Sig Dispense Refill  . atorvastatin (LIPITOR) 20 MG tablet Take 1 tablet (20 mg total) by mouth daily. 90 tablet 2  . BIOTIN PO Take by mouth.    Marland Kitchen. buPROPion (WELLBUTRIN XL) 150 MG 24 hr tablet TAKE 1 TABLET BY MOUTH ONCE DAILY 30 tablet 0  . clonazePAM (KLONOPIN) 1 MG tablet Take 1 tablet (1 mg total) by mouth daily as needed for anxiety. 30 tablet 1  . escitalopram (LEXAPRO) 20 MG tablet TAKE ONE TABLET BY MOUTH ONCE DAILY 90 tablet 0   No current facility-administered medications for this visit.     No Known Allergies  Family History  Problem Relation Age of Onset  . Heart disease Father   . Colon polyps Mother   . Cancer Paternal Grandfather        Prostate  . Prostate cancer Paternal Grandfather   . Colon cancer Maternal Aunt   . Colon cancer Other    great maternal aunt ( maternal GF sister )   . Breast cancer Paternal Aunt   . Rectal cancer Neg Hx   . Stomach cancer Neg Hx     Social History   Socioeconomic History  . Marital status: Divorced    Spouse name: Not on file  . Number of children: Not on file  . Years of education: Not on file  . Highest education level: Not on file  Social Needs  . Financial resource strain: Not on file  . Food insecurity - worry: Not on file  . Food insecurity - inability: Not on file  . Transportation needs - medical: Not on file  . Transportation needs - non-medical: Not on file  Occupational History  . Not on file  Tobacco Use  . Smoking status: Former Games developermoker  . Smokeless tobacco: Never Used  Substance and Sexual Activity  . Alcohol use: Yes    Alcohol/week: 0.0 oz    Comment: Social  . Drug use: No  . Sexual activity: Yes    Birth control/protection: Surgical    Comment: BTL  Other Topics Concern  . Not on file  Social History Narrative  . Not on file    ROS:  Constitutional: Denies fever, malaise, fatigue, headache or abrupt weight changes.  HEENT: Denies eye pain, eye redness,  ear pain, ringing in the ears, wax buildup, runny nose, nasal congestion, bloody nose, or sore throat. Respiratory: Denies difficulty breathing, shortness of breath, cough or sputum production.   Cardiovascular: Denies chest pain, chest tightness, palpitations or swelling in the hands or feet.  Gastrointestinal: Pt reports intermittent constipation. Denies abdominal pain, bloating, diarrhea or blood in the stool.  GU: Denies frequency, urgency, pain with urination, blood in urine, odor or discharge. Musculoskeletal: Denies decrease in range of motion, difficulty with gait, muscle pain or joint pain and swelling.  Skin: Denies redness, rashes, lesions or ulcercations.  Neurological: Denies dizziness, difficulty with memory, difficulty with speech or problems with balance and coordination.  Psych: Pt  reports history of anxiety and depression. Denies SI/HI.  No other specific complaints in a complete review of systems (except as listed in HPI above).  PE:  BP 108/76   Pulse 61   Temp 97.9 F (36.6 C) (Oral)   Wt 157 lb (71.2 kg)   SpO2 99%   BMI 25.34 kg/m   Wt Readings from Last 3 Encounters:  04/19/17 156 lb 8 oz (71 kg)  12/24/16 169 lb (76.7 kg)  09/05/16 159 lb (72.1 kg)    General: Appears her stated age, well developed, well nourished in NAD. Cardiovascular: Normal rate and rhythm. S1,S2 noted.  No murmur, rubs or gallops noted. No JVD or BLE edema.  Pulmonary/Chest: Normal effort and positive vesicular breath sounds. No respiratory distress. No wheezes, rales or ronchi noted.  Abdomen: Soft and nontender. Normal bowel sounds Neurological: Alert and oriented.  Psychiatric: Mood and affect normal. Behavior is normal. Judgment and thought content normal.     BMET    Component Value Date/Time   NA 145 04/19/2017 1510   K 4.5 04/19/2017 1510   CL 105 04/19/2017 1510   CO2 19 (L) 04/19/2017 1510   GLUCOSE 89 04/19/2017 1510   BUN 10 04/19/2017 1510   CREATININE 0.96 04/19/2017 1510   CALCIUM 9.6 04/19/2017 1510    Lipid Panel     Component Value Date/Time   CHOL 255 (H) 03/27/2016 1200   TRIG 68.0 03/27/2016 1200   HDL 54.90 03/27/2016 1200   CHOLHDL 5 03/27/2016 1200   VLDL 13.6 03/27/2016 1200   LDLCALC 186 (H) 03/27/2016 1200    CBC    Component Value Date/Time   WBC 6.2 04/19/2017 1510   RBC 4.33 04/19/2017 1510   HGB 13.6 04/19/2017 1510   HCT 38.7 04/19/2017 1510   PLT 248 04/19/2017 1510   MCV 89.4 04/19/2017 1510   MCH 31.4 04/19/2017 1510   MCHC 35.1 04/19/2017 1510   RDW 12.0 04/19/2017 1510   LYMPHSABS 1,947 04/19/2017 1510   MONOABS 0.4 06/21/2015 0831   EOSABS 81 04/19/2017 1510   BASOSABS 50 04/19/2017 1510    Hgb A1C No results found for: HGBA1C   Assessment and Plan:

## 2017-05-20 NOTE — Assessment & Plan Note (Signed)
Chronic but stable on Wellbutrin and Lexapro, refilled today Rare Clonazepam use

## 2017-05-20 NOTE — Assessment & Plan Note (Signed)
Continue Atorvastatin Encouraged her to consume a low fat diet Will check lipid profile and CMET with annual exam

## 2017-05-20 NOTE — Assessment & Plan Note (Signed)
Encouraged high fiber diet Continue Mirilax as needed

## 2017-05-20 NOTE — Patient Instructions (Signed)
Fat and Cholesterol Restricted Diet Getting too much fat and cholesterol in your diet may cause health problems. Following this diet helps keep your fat and cholesterol at normal levels. This can keep you from getting sick. What types of fat should I choose?  Choose monosaturated and polyunsaturated fats. These are found in foods such as olive oil, canola oil, flaxseeds, walnuts, almonds, and seeds.  Eat more omega-3 fats. Good choices include salmon, mackerel, sardines, tuna, flaxseed oil, and ground flaxseeds.  Limit saturated fats. These are in animal products such as meats, butter, and cream. They can also be in plant products such as palm oil, palm kernel oil, and coconut oil.  Avoid foods with partially hydrogenated oils in them. These contain trans fats. Examples of foods that have trans fats are stick margarine, some tub margarines, cookies, crackers, and other baked goods. What general guidelines do I need to follow?  Check food labels. Look for the words "trans fat" and "saturated fat."  When preparing a meal: ? Fill half of your plate with vegetables and green salads. ? Fill one fourth of your plate with whole grains. Look for the word "whole" as the first word in the ingredient list. ? Fill one fourth of your plate with lean protein foods.  Eat more foods that have fiber, like apples, carrots, beans, peas, and barley.  Eat more home-cooked foods. Eat less at restaurants and buffets.  Limit or avoid alcohol.  Limit foods high in starch and sugar.  Limit fried foods.  Cook foods without frying them. Baking, boiling, grilling, and broiling are all great options.  Lose weight if you are overweight. Losing even a small amount of weight can help your overall health. It can also help prevent diseases such as diabetes and heart disease. What foods can I eat? Grains Whole grains, such as whole wheat or whole grain breads, crackers, cereals, and pasta. Unsweetened oatmeal,  bulgur, barley, quinoa, or brown rice. Corn or whole wheat flour tortillas. Vegetables Fresh or frozen vegetables (raw, steamed, roasted, or grilled). Green salads. Fruits All fresh, canned (in natural juice), or frozen fruits. Meat and Other Protein Products Ground beef (85% or leaner), grass-fed beef, or beef trimmed of fat. Skinless chicken or turkey. Ground chicken or turkey. Pork trimmed of fat. All fish and seafood. Eggs. Dried beans, peas, or lentils. Unsalted nuts or seeds. Unsalted canned or dry beans. Dairy Low-fat dairy products, such as skim or 1% milk, 2% or reduced-fat cheeses, low-fat ricotta or cottage cheese, or plain low-fat yogurt. Fats and Oils Tub margarines without trans fats. Light or reduced-fat mayonnaise and salad dressings. Avocado. Olive, canola, sesame, or safflower oils. Natural peanut or almond butter (choose ones without added sugar and oil). The items listed above may not be a complete list of recommended foods or beverages. Contact your dietitian for more options. What foods are not recommended? Grains White bread. White pasta. White rice. Cornbread. Bagels, pastries, and croissants. Crackers that contain trans fat. Vegetables White potatoes. Corn. Creamed or fried vegetables. Vegetables in a cheese sauce. Fruits Dried fruits. Canned fruit in light or heavy syrup. Fruit juice. Meat and Other Protein Products Fatty cuts of meat. Ribs, chicken wings, bacon, sausage, bologna, salami, chitterlings, fatback, hot dogs, bratwurst, and packaged luncheon meats. Liver and organ meats. Dairy Whole or 2% milk, cream, half-and-half, and cream cheese. Whole milk cheeses. Whole-fat or sweetened yogurt. Full-fat cheeses. Nondairy creamers and whipped toppings. Processed cheese, cheese spreads, or cheese curds. Sweets and Desserts Corn   syrup, sugars, honey, and molasses. Candy. Jam and jelly. Syrup. Sweetened cereals. Cookies, pies, cakes, donuts, muffins, and ice  cream. Fats and Oils Butter, stick margarine, lard, shortening, ghee, or bacon fat. Coconut, palm kernel, or palm oils. Beverages Alcohol. Sweetened drinks (such as sodas, lemonade, and fruit drinks or punches). The items listed above may not be a complete list of foods and beverages to avoid. Contact your dietitian for more information. This information is not intended to replace advice given to you by your health care provider. Make sure you discuss any questions you have with your health care provider. Document Released: 10/16/2011 Document Revised: 12/22/2015 Document Reviewed: 07/16/2013 Elsevier Interactive Patient Education  2018 Elsevier Inc.  

## 2017-05-20 NOTE — Assessment & Plan Note (Signed)
Continue Flonase as needed

## 2017-07-08 ENCOUNTER — Other Ambulatory Visit: Payer: Self-pay | Admitting: Family Medicine

## 2017-10-21 ENCOUNTER — Telehealth: Payer: Self-pay | Admitting: Internal Medicine

## 2017-10-21 NOTE — Telephone Encounter (Signed)
Copied from CRM [id number]. Topic: Quick Communication - Rx Refill/Question >> Oct 21, 2017 12:10 PM Zada GirtLander, [place] wrote: Medication: Vitamin D 50,000 units 1x weekly (new request) [facility] and her vitamin D levels are severely low. Patient is hoping she can get this w/o an appointment.   Has the patient contacted their pharmacy? No. (Agent: If no, request that the patient contact the pharmacy for the refill.) (Agent: If yes, when and what did the pharmacy advise?)  Preferred Pharmacy (with phone number or street name): [facility] [address] - [place], [place] - [id number] [place] [address] Phone: [phone] Fax: [phone]   Agent: Please be advised that RX refills may take up to 3 business days. We ask that you follow-up with your pharmacy.

## 2017-10-22 NOTE — Telephone Encounter (Signed)
She is past due for annual exam. She needs to be seen

## 2017-10-23 NOTE — Telephone Encounter (Signed)
Left detailed msg on VM per HIPAA  

## 2017-11-04 ENCOUNTER — Ambulatory Visit: Payer: BC Managed Care – PPO | Admitting: Internal Medicine

## 2017-11-04 LAB — THYROID PANEL
T3 Uptake: 41.1
T4, Total: 6.1
TSH: 2.24

## 2017-11-04 LAB — CBC
HCT: 41
HEMOGLOBIN: 13.9
RED BLOOD CELL COUNT: 4.52
WBC: 6.2

## 2017-11-04 LAB — IRON
Iron: 86
Total Iron Binding Capacity: 376.2

## 2017-11-04 LAB — VITAMIN D, 1,25 + 25-HYDROXY: VIT D 25 HYDROXY: 19.5

## 2017-12-03 ENCOUNTER — Other Ambulatory Visit: Payer: Self-pay | Admitting: Internal Medicine

## 2017-12-23 ENCOUNTER — Encounter: Payer: BC Managed Care – PPO | Admitting: Internal Medicine

## 2018-02-04 ENCOUNTER — Encounter: Payer: BC Managed Care – PPO | Admitting: Internal Medicine

## 2018-02-04 ENCOUNTER — Other Ambulatory Visit: Payer: Self-pay | Admitting: Internal Medicine

## 2018-02-06 ENCOUNTER — Encounter: Payer: BC Managed Care – PPO | Admitting: Internal Medicine

## 2018-02-11 ENCOUNTER — Ambulatory Visit: Payer: BC Managed Care – PPO | Admitting: Gastroenterology

## 2018-02-26 ENCOUNTER — Other Ambulatory Visit: Payer: Self-pay | Admitting: Internal Medicine

## 2018-03-11 ENCOUNTER — Other Ambulatory Visit: Payer: Self-pay | Admitting: Internal Medicine

## 2018-03-25 ENCOUNTER — Ambulatory Visit (INDEPENDENT_AMBULATORY_CARE_PROVIDER_SITE_OTHER): Payer: BC Managed Care – PPO | Admitting: Internal Medicine

## 2018-03-25 ENCOUNTER — Encounter

## 2018-03-25 ENCOUNTER — Encounter: Payer: Self-pay | Admitting: Internal Medicine

## 2018-03-25 VITALS — BP 118/68 | HR 58 | Temp 98.1°F | Ht 65.5 in | Wt 158.0 lb

## 2018-03-25 DIAGNOSIS — Z0000 Encounter for general adult medical examination without abnormal findings: Secondary | ICD-10-CM | POA: Diagnosis not present

## 2018-03-25 DIAGNOSIS — F329 Major depressive disorder, single episode, unspecified: Secondary | ICD-10-CM

## 2018-03-25 DIAGNOSIS — F419 Anxiety disorder, unspecified: Secondary | ICD-10-CM | POA: Diagnosis not present

## 2018-03-25 DIAGNOSIS — E7800 Pure hypercholesterolemia, unspecified: Secondary | ICD-10-CM | POA: Diagnosis not present

## 2018-03-25 DIAGNOSIS — K582 Mixed irritable bowel syndrome: Secondary | ICD-10-CM | POA: Diagnosis not present

## 2018-03-25 DIAGNOSIS — Z0001 Encounter for general adult medical examination with abnormal findings: Secondary | ICD-10-CM | POA: Diagnosis not present

## 2018-03-25 NOTE — Patient Instructions (Signed)

## 2018-03-25 NOTE — Progress Notes (Signed)
Subjective:    Patient ID: Amanda Parrish, female    DOB: 06/29/1965, 52 y.o.   MRN: [id number]  HPI  Pt presents to the clinic today for her annual exam. She is also due to follow up chronic conditions.  Anxiety and Depression: Chronic but stable on Lexapro and Wellbutrin as prescribed. She takes Clonazepam on a very rare basis.  IBS: Mainly constipation. She consumes a high fiber diet and takes Mirilax as needed  HLD: Her last LDL was 186, 02/2016. She denies myalgias on Atorvastatin. She does not consume a low fat diet.  Flu: 01/2018 Tetanus: 09/2009 Pap Smear: 2018, [facility] Mammogram: 2017, [facility] Colon Screening: 08/2015 Vision Screening: annually  Dentist: biannually  Diet: She does eat some meat. She consumes fruits and veggies daily. She tries to avoid fried foods. She drinks mostly water. Exercise: treadmill, weights  Review of Systems  Past Medical History:  Diagnosis Date  . Allergy   . Anxiety   . Arthritis    hands and feet and hips   . Depression   . Hyperlipidemia   . IBS (irritable bowel syndrome)     Current Outpatient Medications  Medication Sig Dispense Refill  . atorvastatin (LIPITOR) 20 MG tablet TAKE ONE TABLET BY MOUTH ONCE DAILY 90 tablet 2  . BIOTIN PO Take by mouth.    . clonazePAM (KLONOPIN) 1 MG tablet Take 1 tablet (1 mg total) by mouth daily as needed for anxiety. 30 tablet 1  . escitalopram (LEXAPRO) 20 MG tablet Take 1 tablet (20 mg total) by mouth daily. 30 tablet 0   No current facility-administered medications for this visit.     Allergies  Allergen Reactions  . Bupropion Palpitations    Family History  Problem Relation Age of Onset  . Heart disease Father   . Colon polyps Mother   . Cancer Paternal Grandfather        Prostate  . Prostate cancer Paternal Grandfather   . Colon cancer Maternal Aunt   . Colon cancer Other        great maternal aunt ( maternal GF sister )   . Breast cancer  Paternal Aunt   . Rectal cancer Neg Hx   . Stomach cancer Neg Hx     Social History   Socioeconomic History  . Marital status: Divorced    Spouse name: Not on file  . Number of children: Not on file  . Years of education: Not on file  . Highest education level: Not on file  Occupational History  . Not on file  Social Needs  . Financial resource strain: Not on file  . Food insecurity:    Worry: Not on file    Inability: Not on file  . Transportation needs:    Medical: Not on file    Non-medical: Not on file  Tobacco Use  . Smoking status: Former Games developer  . Smokeless tobacco: Never Used  Substance and Sexual Activity  . Alcohol use: Yes    Alcohol/week: 0.0 standard drinks    Comment: Social  . Drug use: No  . Sexual activity: Yes    Birth control/protection: Surgical    Comment: BTL  Lifestyle  . Physical activity:    Days per week: Not on file    Minutes per session: Not on file  . Stress: Not on file  Relationships  . Social connections:    Talks on phone: Not on file    Gets together:  Not on file    Attends religious service: Not on file    Active member of club or organization: Not on file    Attends meetings of clubs or organizations: Not on file    Relationship status: Not on file  . Intimate partner violence:    Fear of current or ex partner: Not on file    Emotionally abused: Not on file    Physically abused: Not on file    Forced sexual activity: Not on file  Other Topics Concern  . Not on file  Social History Narrative  . Not on file     Constitutional: Denies fever, malaise, fatigue, headache or abrupt weight changes.  HEENT: Pt reports ear fullness, metallic taste in mouth. Denies eye pain, eye redness, ear pain, ringing in the ears, wax buildup, runny nose, nasal congestion, bloody nose, or sore throat. Respiratory: Denies difficulty breathing, shortness of breath, cough or sputum production.   Cardiovascular: Denies chest pain, chest  tightness, palpitations or swelling in the hands or feet.  Gastrointestinal: Pt reports alternating constipation and diarrhea. Denies abdominal pain, bloating, or blood in the stool.  GU: Denies urgency, frequency, pain with urination, burning sensation, blood in urine, odor or discharge. Musculoskeletal: Denies decrease in range of motion, difficulty with gait, muscle pain or joint pain and swelling.  Skin: Denies redness, rashes, lesions or ulcercations.  Neurological: Denies dizziness, difficulty with memory, difficulty with speech or problems with balance and coordination.  Psych: Pt has a history of anxiety and depression.Denies SI/HI.  No other specific complaints in a complete review of systems (except as listed in HPI above).     Objective:   Physical Exam   BP 118/68   Pulse (!) 58   Temp 98.1 F (36.7 C) (Oral)   Ht 5' 5.5" (1.664 m)   Wt 158 lb (71.7 kg)   SpO2 98%   BMI 25.89 kg/m  Wt Readings from Last 3 Encounters:  03/25/18 158 lb (71.7 kg)  05/20/17 157 lb (71.2 kg)  04/19/17 156 lb 8 oz (71 kg)    General: Appears her stated age, well developed, well nourished in NAD. Skin: Warm, dry and intact. Eczema noted of b/l ear canals. HEENT: Head: normal shape and size; Eyes: sclera white, no icterus, conjunctiva pink, PERRLA and EOMs intact; Ears: Tm's gray and intact, normal light reflex; Nose: mucosa pink and moist, septum midline; Throat/Mouth: Teeth present, mucosa pink and moist, no exudate noted. Multiple mucoceles noted of right tonsillar pillar. Neck:  Neck supple, trachea midline. No masses, lumps or thyromegaly present.  Cardiovascular: Normal rate and rhythm. S1,S2 noted.  No murmur, rubs or gallops noted. No JVD or BLE edema. No carotid bruits noted. Pulmonary/Chest: Normal effort and positive vesicular breath sounds. No respiratory distress. No wheezes, rales or ronchi noted.  Abdomen: Soft and nontender. Normal bowel sounds. No distention or masses noted.  Liver, spleen and kidneys non palpable. Musculoskeletal: Strength 5/5 BUE/BLE. No difficulty with gait.  Neurological: Alert and oriented. Cranial nerves II-XII grossly intact. Coordination normal.  Psychiatric: Mood and affect normal. Behavior is normal. Judgment and thought content normal.     BMET    Component Value Date/Time   NA 145 04/19/2017 1510   K 4.5 04/19/2017 1510   CL 105 04/19/2017 1510   CO2 19 (L) 04/19/2017 1510   GLUCOSE 89 04/19/2017 1510   BUN 10 04/19/2017 1510   CREATININE 0.96 04/19/2017 1510   CALCIUM 9.6 04/19/2017 1510  Lipid Panel     Component Value Date/Time   CHOL 255 (H) 03/27/2016 1200   TRIG 68.0 03/27/2016 1200   HDL 54.90 03/27/2016 1200   CHOLHDL 5 03/27/2016 1200   VLDL 13.6 03/27/2016 1200   LDLCALC 186 (H) 03/27/2016 1200    CBC    Component Value Date/Time   WBC 6.2 10/16/2017   RBC 4.33 04/19/2017 1510   HGB 13.6 04/19/2017 1510   HCT 41 10/16/2017   PLT 248 04/19/2017 1510   MCV 89.4 04/19/2017 1510   MCH 31.4 04/19/2017 1510   MCHC 35.1 04/19/2017 1510   RDW 12.0 04/19/2017 1510   LYMPHSABS 1,947 04/19/2017 1510   MONOABS 0.4 06/21/2015 0831   EOSABS 81 04/19/2017 1510   BASOSABS 50 04/19/2017 1510    Hgb A1C No results found for: HGBA1C         Assessment & Plan:   Preventative Health Maintenance:  Flu and Tetanus UTD Pap smear UTD- will request copy She will call to schedule her mammogram Colon screening UTD Encouraged her to consume a balanced diet and exercise regimen Advised her to see an eye doctor and dentist annually Will check CBC, CMET, Lipid and Vit D today  RTC in 1 year, sooner if needed Nicki Reaper, NP

## 2018-03-25 NOTE — Assessment & Plan Note (Signed)
Chronic but stable on Lexapro, Wellbutrin and Clonazepam She will continue to follow with psych

## 2018-03-25 NOTE — Assessment & Plan Note (Signed)
CMET and Lipid profile today Encouraged her to consume a low fat diet Continue Atorvastatin for now 

## 2018-03-25 NOTE — Assessment & Plan Note (Signed)
Controlled with diet Will monitor 

## 2018-03-26 LAB — CBC
HCT: 40.8 % (ref 36.0–46.0)
Hemoglobin: 14.1 g/dL (ref 12.0–15.0)
MCHC: 34.5 g/dL (ref 30.0–36.0)
MCV: 90.7 fl (ref 78.0–100.0)
Platelets: 264 10*3/uL (ref 150.0–400.0)
RBC: 4.5 Mil/uL (ref 3.87–5.11)
RDW: 12.9 % (ref 11.5–15.5)
WBC: 6.2 10*3/uL (ref 4.0–10.5)

## 2018-03-26 LAB — COMPREHENSIVE METABOLIC PANEL
ALBUMIN: 4.6 g/dL (ref 3.5–5.2)
ALT: 14 U/L (ref 0–35)
AST: 20 U/L (ref 0–37)
Alkaline Phosphatase: 54 U/L (ref 39–117)
BUN: 13 mg/dL (ref 6–23)
CHLORIDE: 100 meq/L (ref 96–112)
CO2: 28 meq/L (ref 19–32)
CREATININE: 0.92 mg/dL (ref 0.40–1.20)
Calcium: 9.6 mg/dL (ref 8.4–10.5)
GFR: 68.33 mL/min (ref >60.00)
Glucose, Bld: 84 mg/dL (ref 70–99)
Potassium: 4 mEq/L (ref 3.5–5.1)
SODIUM: 137 meq/L (ref 135–145)
Total Bilirubin: 0.6 mg/dL (ref 0.2–1.2)
Total Protein: 7.8 g/dL (ref 6.0–8.3)

## 2018-03-26 LAB — LIPID PANEL
CHOL/HDL RATIO: 4
Cholesterol: 222 mg/dL — ABNORMAL HIGH (ref 0–200)
HDL: 50.8 mg/dL (ref >39.00)
LDL Cholesterol: 159 mg/dL — ABNORMAL HIGH (ref 0–99)
NonHDL: 170.95
Triglycerides: 62 mg/dL (ref 0.0–149.0)
VLDL: 12.4 mg/dL (ref 0.0–40.0)

## 2018-03-26 LAB — VITAMIN D 25 HYDROXY (VIT D DEFICIENCY, FRACTURES): VITD: 60.77 ng/mL (ref 30.00–100.00)

## 2018-04-02 ENCOUNTER — Telehealth: Payer: Self-pay

## 2018-04-02 DIAGNOSIS — E7800 Pure hypercholesterolemia, unspecified: Secondary | ICD-10-CM

## 2018-04-02 MED ORDER — ATORVASTATIN CALCIUM 40 MG PO TABS: 40.0000 mg | ORAL_TABLET | Freq: Every day | ORAL | 0 refills | Status: DC

## 2018-04-02 NOTE — Telephone Encounter (Signed)
Left detailed msg on VM per HIPAA Rx sent through [facility] Labs future ordered---needs to schedule lab only appt in 3 months

## 2018-04-02 NOTE — Telephone Encounter (Signed)
-----   Message from Lorre Munroeegina W Baity, NP sent at 03/26/2018  1:16 PM EST ----- Call pt:  Liver and kidney function are normal. Cholesterol remains elevated. We need to increase Atorvastatin to 40 mg, please send in #30, 2 refills. Blood counts normal. Vit D normal. Repeat lipid in 3 months, lab only.

## 2018-04-14 ENCOUNTER — Other Ambulatory Visit: Payer: Self-pay | Admitting: Internal Medicine

## 2018-04-18 NOTE — Telephone Encounter (Signed)
Left detailed msg on VM per HIPAA  

## 2018-06-02 ENCOUNTER — Other Ambulatory Visit: Payer: Self-pay | Admitting: Internal Medicine

## 2018-06-18 ENCOUNTER — Other Ambulatory Visit: Payer: Self-pay | Admitting: Gynecology

## 2018-06-18 DIAGNOSIS — R921 Mammographic calcification found on diagnostic imaging of breast: Secondary | ICD-10-CM

## 2018-06-23 ENCOUNTER — Other Ambulatory Visit: Payer: Self-pay | Admitting: Gynecology

## 2018-06-23 DIAGNOSIS — Z1231 Encounter for screening mammogram for malignant neoplasm of breast: Secondary | ICD-10-CM

## 2018-06-26 ENCOUNTER — Ambulatory Visit
Admission: RE | Admit: 2018-06-26 | Discharge: 2018-06-26 | Disposition: A | Discharge status: Home / Self Care | Payer: BC Managed Care – PPO | Source: Ambulatory Visit | Attending: Gynecology | Admitting: Gynecology

## 2018-06-26 DIAGNOSIS — Z1231 Encounter for screening mammogram for malignant neoplasm of breast: Secondary | ICD-10-CM

## 2018-07-23 ENCOUNTER — Encounter: Payer: BC Managed Care – PPO | Admitting: Gynecology

## 2018-09-02 ENCOUNTER — Other Ambulatory Visit: Payer: Self-pay | Admitting: Internal Medicine

## 2018-09-07 ENCOUNTER — Other Ambulatory Visit: Payer: Self-pay | Admitting: Internal Medicine

## 2018-09-08 MED ORDER — ATORVASTATIN CALCIUM 40 MG PO TABS: 40.0000 mg | ORAL_TABLET | Freq: Every day | ORAL | 0 refills | Status: DC

## 2018-09-15 ENCOUNTER — Other Ambulatory Visit: Payer: Self-pay

## 2018-09-15 ENCOUNTER — Ambulatory Visit (INDEPENDENT_AMBULATORY_CARE_PROVIDER_SITE_OTHER): Payer: BC Managed Care – PPO | Admitting: Internal Medicine

## 2018-09-15 ENCOUNTER — Encounter: Payer: Self-pay | Admitting: Internal Medicine

## 2018-09-15 DIAGNOSIS — B3731 Acute candidiasis of vulva and vagina: Secondary | ICD-10-CM

## 2018-09-15 DIAGNOSIS — J029 Acute pharyngitis, unspecified: Secondary | ICD-10-CM | POA: Diagnosis not present

## 2018-09-15 DIAGNOSIS — R5383 Other fatigue: Secondary | ICD-10-CM | POA: Diagnosis not present

## 2018-09-15 DIAGNOSIS — R519 Headache, unspecified: Secondary | ICD-10-CM

## 2018-09-15 DIAGNOSIS — R51 Headache: Secondary | ICD-10-CM

## 2018-09-15 DIAGNOSIS — H9201 Otalgia, right ear: Secondary | ICD-10-CM | POA: Diagnosis not present

## 2018-09-15 DIAGNOSIS — B373 Candidiasis of vulva and vagina: Secondary | ICD-10-CM

## 2018-09-15 MED ORDER — AMOXICILLIN 875 MG PO TABS: 875.0000 mg | ORAL_TABLET | Freq: Two times a day (BID) | ORAL | 0 refills | Status: DC

## 2018-09-15 NOTE — Patient Instructions (Signed)

## 2018-09-15 NOTE — Progress Notes (Signed)
Virtual Visit via Video Note  I connected with Amanda GagePhyllis B Parrish on 09/15/18 at 12:15 PM EDT by a video enabled telemedicine application and verified that I am speaking with the correct person using two identifiers.  Location: Patient: Home Provider: Office   I discussed the limitations of evaluation and management by telemedicine and the availability of in person appointments. The patient expressed understanding and agreed to proceed.  History of Present Illness:  Pt reports headache, fatigue, ear pain,  and sore throat. This started 1 week ago. The headache is located in her forehead. She describes the pain as pressure. She describes the ear pain as fullness. She denies loss of hearing. She denies difficulty swallowing, but reports there is a white spot on her tonsils. She denies fever, chills or body aches. She has tried Zyrtec, Flonase, Sudafed and warm salt gargles with some relief.  She has not had sick contacts that she is aware of.    Past Medical History:  Diagnosis Date  . Allergy   . Anxiety   . Arthritis    hands and feet and hips   . Depression   . Hyperlipidemia   . IBS (irritable bowel syndrome)     Current Outpatient Medications  Medication Sig Dispense Refill  . atorvastatin (LIPITOR) 40 MG tablet Take 1 tablet by mouth once daily 90 tablet 0  . atorvastatin (LIPITOR) 40 MG tablet Take 1 tablet (40 mg total) by mouth daily. 90 tablet 0  . BIOTIN PO Take by mouth.    Marland Kitchen. buPROPion (WELLBUTRIN XL) 150 MG 24 hr tablet TAKE 1 TABLET BY MOUTH ONCE DAILY 90 tablet 1  . clonazePAM (KLONOPIN) 1 MG tablet Take 1 tablet (1 mg total) by mouth daily as needed for anxiety. 30 tablet 1  . escitalopram (LEXAPRO) 20 MG tablet TAKE 1 TABLET BY MOUTH ONCE DAILY 90 tablet 1   No current facility-administered medications for this visit.     No Active Allergies  Family History  Problem Relation Age of Onset  . Heart disease Father   . Colon polyps Mother   . Cancer Paternal  Grandfather        Prostate  . Prostate cancer Paternal Grandfather   . Colon cancer Maternal Aunt   . Colon cancer Other        great maternal aunt ( maternal GF sister )   . Breast cancer Paternal Aunt   . Breast cancer Paternal Grandmother   . Rectal cancer Neg Hx   . Stomach cancer Neg Hx     Social History   Socioeconomic History  . Marital status: Divorced    Spouse name: Not on file  . Number of children: Not on file  . Years of education: Not on file  . Highest education level: Not on file  Occupational History  . Not on file  Social Needs  . Financial resource strain: Not on file  . Food insecurity:    Worry: Not on file    Inability: Not on file  . Transportation needs:    Medical: Not on file    Non-medical: Not on file  Tobacco Use  . Smoking status: Former Games developermoker  . Smokeless tobacco: Never Used  Substance and Sexual Activity  . Alcohol use: Yes    Alcohol/week: 0.0 standard drinks    Comment: Social  . Drug use: No  . Sexual activity: Yes    Birth control/protection: Surgical    Comment: BTL  Lifestyle  . Physical  activity:    Days per week: Not on file    Minutes per session: Not on file  . Stress: Not on file  Relationships  . Social connections:    Talks on phone: Not on file    Gets together: Not on file    Attends religious service: Not on file    Active member of club or organization: Not on file    Attends meetings of clubs or organizations: Not on file    Relationship status: Not on file  . Intimate partner violence:    Fear of current or ex partner: Not on file    Emotionally abused: Not on file    Physically abused: Not on file    Forced sexual activity: Not on file  Other Topics Concern  . Not on file  Social History Narrative  . Not on file     Constitutional: Pt reports headache, fatigue. Denies fever, malaise, or abrupt weight changes.  HEENT: Pt reports ear pain, sore throat. Denies eye pain, eye redness, ringing in the  ears, wax buildup, runny nose, nasal congestion, bloody nose. Respiratory: Denies difficulty breathing, shortness of breath, cough or sputum production.   Cardiovascular: Denies chest pain, chest tightness, palpitations or swelling in the hands or feet.   No other specific complaints in a complete review of systems (except as listed in HPI above).   Wt Readings from Last 3 Encounters:  03/25/18 158 lb (71.7 kg)  05/20/17 157 lb (71.2 kg)  04/19/17 156 lb 8 oz (71 kg)    General: Appears her stated age, well developed, well nourished in NAD. HEENT: Head: normal shape and size; Eyes: sclera white, EOMs intact;  Pulmonary/Chest: Normal effort. No respiratory distress.  Neurological: Alert and oriented.   BMET    Component Value Date/Time   NA 137 03/25/2018 1533   K 4.0 03/25/2018 1533   CL 100 03/25/2018 1533   CO2 28 03/25/2018 1533   GLUCOSE 84 03/25/2018 1533   BUN 13 03/25/2018 1533   CREATININE 0.92 03/25/2018 1533   CREATININE 0.96 04/19/2017 1510   CALCIUM 9.6 03/25/2018 1533    Lipid Panel     Component Value Date/Time   CHOL 222 (H) 03/25/2018 1533   TRIG 62.0 03/25/2018 1533   HDL 50.80 03/25/2018 1533   CHOLHDL 4 03/25/2018 1533   VLDL 12.4 03/25/2018 1533   LDLCALC 159 (H) 03/25/2018 1533    CBC    Component Value Date/Time   WBC 6.2 03/25/2018 1533   RBC 4.50 03/25/2018 1533   HGB 14.1 03/25/2018 1533   HCT 40.8 03/25/2018 1533   HCT 41 10/16/2017   PLT 264.0 03/25/2018 1533   MCV 90.7 03/25/2018 1533   MCH 31.4 04/19/2017 1510   MCHC 34.5 03/25/2018 1533   RDW 12.9 03/25/2018 1533   LYMPHSABS 1,947 04/19/2017 1510   MONOABS 0.4 06/21/2015 0831   EOSABS 81 04/19/2017 1510   BASOSABS 50 04/19/2017 1510    Hgb A1C No results found for: HGBA1C      Assessment and Plan:  Headache, Fatigue, Sore Throat, Ear Pain:  Will cover for possible infection with Amoxil 875 mg BID x 10 days Continue Zyrtec and Flonase for now Continue salt water  gargles  Return precautions discussed   Follow Up Instructions:    I discussed the assessment and treatment plan with the patient. The patient was provided an opportunity to ask questions and all were answered. The patient agreed with the plan and demonstrated  an understanding of the instructions.   The patient was advised to call back or seek an in-person evaluation if the symptoms worsen or if the condition fails to improve as anticipated.    Nicki Reaper, NP

## 2018-09-15 NOTE — Telephone Encounter (Signed)
Pt left v/m; pt had virtual visit today and was prescribed abx and pt forgot to ask for diflucan,one dose pill for yeast infection. Pt request diflucan to [facility] [address]. And pt request cb when done.

## 2018-09-16 ENCOUNTER — Encounter: Payer: BC Managed Care – PPO | Admitting: Gynecology

## 2018-09-16 MED ORDER — FLUCONAZOLE 150 MG PO TABS: 150.0000 mg | ORAL_TABLET | Freq: Once | ORAL | 0 refills | Status: AC

## 2018-09-17 ENCOUNTER — Encounter: Payer: BC Managed Care – PPO | Admitting: Gynecology

## 2018-10-17 ENCOUNTER — Other Ambulatory Visit: Payer: Self-pay | Admitting: Internal Medicine

## 2018-11-19 ENCOUNTER — Telehealth: Payer: Self-pay | Admitting: Internal Medicine

## 2018-11-19 NOTE — Telephone Encounter (Signed)
Patient stated that you were able to see the office notes from her ENT Doctor. (Dr Janace Hoard)  She would like to know if she is able to get a copy of those notes to take with her to her appointment on Friday with her New ENT doctor ?  C/B # [phone]

## 2018-11-20 NOTE — Telephone Encounter (Signed)
She has to go through medical records

## 2018-11-20 NOTE — Telephone Encounter (Signed)
Patient called back about message sent yesterday.  She stated this is for a new throat doctor.  And would like any notes that may pertain to this.   C/B# [phone]

## 2018-11-20 NOTE — Telephone Encounter (Signed)
Patient called back to check on messages sent.,  Advised her of message below.  Patient will be going through medical records to obtain these

## 2018-11-25 ENCOUNTER — Telehealth: Payer: Self-pay | Admitting: Internal Medicine

## 2018-11-25 NOTE — Telephone Encounter (Signed)
error 

## 2018-11-26 ENCOUNTER — Encounter: Payer: Self-pay | Admitting: Internal Medicine

## 2018-11-26 ENCOUNTER — Ambulatory Visit (INDEPENDENT_AMBULATORY_CARE_PROVIDER_SITE_OTHER): Payer: BC Managed Care – PPO | Admitting: Internal Medicine

## 2018-11-26 DIAGNOSIS — J020 Streptococcal pharyngitis: Secondary | ICD-10-CM

## 2018-11-26 NOTE — Progress Notes (Signed)
Virtual Visit via Video Note  I connected with Amanda Parrish on 11/26/18 at  2:00 PM EDT by a video enabled telemedicine application and verified that I am speaking with the correct person using two identifiers.  Location: Patient: Home Provider: Office   I discussed the limitations of evaluation and management by telemedicine and the availability of in person appointments. The patient expressed understanding and agreed to proceed.  History of Present Illness:  Pt reports headache, fatigue and sore throat. This started 1 week ago. She reports the headache as a general ache. She denies dizziness or visual changes. She denies difficulty swallowing, runny nose, nasal congestion, cough, or SOB. She denies fever, chills or body aches. She was seen and diagnosed with strep throat and is currently on Augmentin. She has had contacts at work diagnosed with [place]. She has tried Tylenol and salt water gargles with some relief.  Past Medical History:  Diagnosis Date  . Allergy   . Anxiety   . Arthritis    hands and feet and hips   . Depression   . Hyperlipidemia   . IBS (irritable bowel syndrome)     Current Outpatient Medications  Medication Sig Dispense Refill  . atorvastatin (LIPITOR) 40 MG tablet Take 1 tablet (40 mg total) by mouth daily. 90 tablet 0  . BIOTIN PO Take by mouth.    Marland Kitchen buPROPion (WELLBUTRIN XL) 150 MG 24 hr tablet TAKE 1 TABLET BY MOUTH ONCE DAILY 90 tablet 1  . clonazePAM (KLONOPIN) 1 MG tablet Take 1 tablet (1 mg total) by mouth daily as needed for anxiety. 30 tablet 1  . escitalopram (LEXAPRO) 20 MG tablet Take 1 tablet by mouth once daily 90 tablet 0  . amoxicillin-clavulanate (AUGMENTIN) 875-125 MG tablet TAKE 1 TABLET BY MOUTH TWICE DAILY FOR 14 DAYS     No current facility-administered medications for this visit.     No Known Allergies  Family History  Problem Relation Age of Onset  . Heart disease Father   . Colon polyps Mother   . Cancer Paternal  Grandfather        Prostate  . Prostate cancer Paternal Grandfather   . Colon cancer Maternal Aunt   . Colon cancer Other        great maternal aunt ( maternal GF sister )   . Breast cancer Paternal Aunt   . Breast cancer Paternal Grandmother   . Rectal cancer Neg Hx   . Stomach cancer Neg Hx     Social History   Socioeconomic History  . Marital status: Divorced    Spouse name: Not on file  . Number of children: Not on file  . Years of education: Not on file  . Highest education level: Not on file  Occupational History  . Not on file  Social Needs  . Financial resource strain: Not on file  . Food insecurity    Worry: Not on file    Inability: Not on file  . Transportation needs    Medical: Not on file    Non-medical: Not on file  Tobacco Use  . Smoking status: Former Research scientist (life sciences)  . Smokeless tobacco: Never Used  Substance and Sexual Activity  . Alcohol use: Yes    Alcohol/week: 0.0 standard drinks    Comment: Social  . Drug use: No  . Sexual activity: Yes    Birth control/protection: Surgical    Comment: BTL  Lifestyle  . Physical activity    Days per week:  Not on file    Minutes per session: Not on file  . Stress: Not on file  Relationships  . Social Musicianconnections    Talks on phone: Not on file    Gets together: Not on file    Attends religious service: Not on file    Active member of club or organization: Not on file    Attends meetings of clubs or organizations: Not on file    Relationship status: Not on file  . Intimate partner violence    Fear of current or ex partner: Not on file    Emotionally abused: Not on file    Physically abused: Not on file    Forced sexual activity: Not on file  Other Topics Concern  . Not on file  Social History Narrative  . Not on file     Constitutional: Pt reports headache, fatigue. Denies fever, malaise, or abrupt weight changes.  HEENT: Pt reports sore throat. Denies eye pain, eye redness, ear pain, ringing in the ears,  wax buildup, runny nose, nasal congestion, bloody nose Respiratory: Denies difficulty breathing, shortness of breath, cough or sputum production.   Cardiovascular: Denies chest pain, chest tightness, palpitations or swelling in the hands or feet.  Gastrointestinal: Denies abdominal pain, bloating, constipation, diarrhea or blood in the stool.  Skin: Denies redness, rashes, lesions or ulcercations.    No other specific complaints in a complete review of systems (except as listed in HPI above). Observations/Objective:  Wt Readings from Last 3 Encounters:  03/25/18 158 lb (71.7 kg)  05/20/17 157 lb (71.2 kg)  04/19/17 156 lb 8 oz (71 kg)    General: Appears her stated age, well developed, well nourished in NAD. Skin: Warm, dry and intact.  HEENT: Head: normal shape and size;   Pulmonary/Chest: Normal effort. No respiratory distress.  Abdomen: Soft and nontender. Normal bowel sounds. No distention or masses noted. Liver, spleen and kidneys non palpable. Neurological: Alert and oriented.    BMET    Component Value Date/Time   NA 137 03/25/2018 1533   K 4.0 03/25/2018 1533   CL 100 03/25/2018 1533   CO2 28 03/25/2018 1533   GLUCOSE 84 03/25/2018 1533   BUN 13 03/25/2018 1533   CREATININE 0.92 03/25/2018 1533   CREATININE 0.96 04/19/2017 1510   CALCIUM 9.6 03/25/2018 1533    Lipid Panel     Component Value Date/Time   CHOL 222 (H) 03/25/2018 1533   TRIG 62.0 03/25/2018 1533   HDL 50.80 03/25/2018 1533   CHOLHDL 4 03/25/2018 1533   VLDL 12.4 03/25/2018 1533   LDLCALC 159 (H) 03/25/2018 1533    CBC    Component Value Date/Time   WBC 6.2 03/25/2018 1533   RBC 4.50 03/25/2018 1533   HGB 14.1 03/25/2018 1533   HCT 40.8 03/25/2018 1533   HCT 41 10/16/2017   PLT 264.0 03/25/2018 1533   MCV 90.7 03/25/2018 1533   MCH 31.4 04/19/2017 1510   MCHC 34.5 03/25/2018 1533   RDW 12.9 03/25/2018 1533   LYMPHSABS 1,947 04/19/2017 1510   MONOABS 0.4 06/21/2015 0831   EOSABS 81  04/19/2017 1510   BASOSABS 50 04/19/2017 1510    Hgb A1C No results found for: HGBA1C      Assessment and Plan:  Strep Pharyngitis:  Advised her if she is improving, no reason to check for COVID at this time Continue Augmentin 875-125 mg BID x 10 until finished Ok to take Ibuprofen 400-600 mg Q8H prn Get rest  and drink plenty of fluids.  Return precautions discussed Nicki Reaperegina Wadell Craddock, NP   Follow Up Instructions:    I discussed the assessment and treatment plan with the patient. The patient was provided an opportunity to ask questions and all were answered. The patient agreed with the plan and demonstrated an understanding of the instructions.   The patient was advised to call back or seek an in-person evaluation if the symptoms worsen or if the condition fails to improve as anticipated.     Nicki Reaperegina Raekwan Spelman, NP

## 2018-11-26 NOTE — Patient Instructions (Signed)
Place pediatric pharyngitis patient instructions here.

## 2018-12-10 ENCOUNTER — Other Ambulatory Visit: Payer: Self-pay | Admitting: Internal Medicine

## 2018-12-10 NOTE — Telephone Encounter (Signed)
Best number [phone] Pt called checking on her rx She has 3 days left  [facility] [address]

## 2018-12-11 NOTE — Telephone Encounter (Signed)
Rx sent through e-scribe  

## 2018-12-24 ENCOUNTER — Encounter: Payer: Self-pay | Admitting: Gynecology

## 2018-12-24 ENCOUNTER — Other Ambulatory Visit: Payer: Self-pay

## 2018-12-24 ENCOUNTER — Ambulatory Visit: Payer: BC Managed Care – PPO | Admitting: Gynecology

## 2018-12-24 VITALS — BP 116/74 | Ht 66.0 in | Wt 134.0 lb

## 2018-12-24 DIAGNOSIS — Z1151 Encounter for screening for human papillomavirus (HPV): Secondary | ICD-10-CM

## 2018-12-24 DIAGNOSIS — N898 Other specified noninflammatory disorders of vagina: Secondary | ICD-10-CM

## 2018-12-24 DIAGNOSIS — R300 Dysuria: Secondary | ICD-10-CM

## 2018-12-24 DIAGNOSIS — Z01419 Encounter for gynecological examination (general) (routine) without abnormal findings: Secondary | ICD-10-CM

## 2018-12-24 LAB — WET PREP FOR TRICH, YEAST, CLUE

## 2018-12-24 MED ORDER — METRONIDAZOLE 500 MG PO TABS: 500.0000 mg | ORAL_TABLET | Freq: Two times a day (BID) | ORAL | 0 refills | Status: DC

## 2018-12-24 NOTE — Addendum Note (Signed)
Addended by: Nelva Nay on: 12/24/2018 03:31 PM   Modules accepted: Orders

## 2018-12-24 NOTE — Progress Notes (Signed)
    Amanda Parrish 12-07-1965 [id number]        53 y.o.  [id number] for annual gynecologic exam.  Notes some urinary frequency and dysuria last week but seems better.  Does note a slight discharge with irritation.  Past medical history,surgical history, problem list, medications, allergies, family history and social history were all reviewed and documented as reviewed in the EPIC chart.  ROS:  Performed with pertinent positives and negatives included in the history, assessment and plan.   Additional significant findings : None   Exam: Caryn Bee assistant Vitals:   12/24/18 1441  BP: 116/74  Weight: 134 lb (60.8 kg)  Height: 5\' 6"  (1.676 m)   Body mass index is 21.63 kg/m.  General appearance:  Normal affect, orientation and appearance. Skin: Grossly normal HEENT: Without gross lesions.  No cervical or supraclavicular adenopathy. Thyroid normal.  Lungs:  Clear without wheezing, rales or rhonchi Cardiac: RR, without RMG Abdominal:  Soft, nontender, without masses, guarding, rebound, organomegaly or hernia Breasts:  Examined lying and sitting without masses, retractions, discharge or axillary adenopathy. Pelvic:  Ext, BUS, Vagina: With white discharge.  Cervix: Normal.  Pap smear/HPV  Uterus: Axial, normal size, shape and contour, midline and mobile nontender   Adnexa: Without masses or tenderness    Anus and perineum: Normal   Rectovaginal: Normal sphincter tone without palpated masses or tenderness.    Assessment/Plan:  53 y.o. [id number] female for annual gynecologic exam.  Status post NovaSure ablation  1. Perimenopausal.  No significant hot flashes, sweats or bleeding. 2. Vaginal discharge.  Wet prep unremarkable.  History consistent with bacterial vaginosis.  Options for management reviewed.  Patient elects for Flagyl 500 mg twice daily x7 days.  Alcohol avoidance discussed.  Follow-up if symptoms persist or recur.  Had some urinary frequency and dysuria last week but this  is cleared.  Urine analysis normal today. 3. Mammography 05/2018.  Continue with annual mammography when due.  Breast exam normal today. 4. Colonoscopy 2017.  Repeat at their recommended interval. 5. Pap smear/HPV 2015.  Pap smear/HPV today.  No history of abnormal Pap smears previously. 6. Health maintenance.  No routine lab work done as this is done elsewhere.  Follow-up 1 year, sooner as needed.   Anastasio Auerbach MD, 3:14 PM 12/24/2018

## 2018-12-24 NOTE — Patient Instructions (Signed)
Take the antibiotic twice daily for 7 days.  Avoid alcohol while taking.  Follow-up if the vaginal symptoms continue.  Follow-up in 1 year, sooner as needed.

## 2018-12-25 LAB — URINALYSIS, COMPLETE W/RFL CULTURE
Bacteria, UA: NONE SEEN /HPF
Bilirubin Urine: NEGATIVE
Glucose, UA: NEGATIVE
Hgb urine dipstick: NEGATIVE
Hyaline Cast: NONE SEEN /LPF
Ketones, ur: NEGATIVE
Leukocyte Esterase: NEGATIVE
Nitrites, Initial: NEGATIVE
Protein, ur: NEGATIVE
RBC / HPF: NONE SEEN /HPF (ref 0–2)
Specific Gravity, Urine: 1.01 (ref 1.001–1.03)
WBC, UA: NONE SEEN /HPF (ref 0–5)
pH: 6.5 (ref 5.0–8.0)

## 2018-12-25 LAB — PAP IG AND HPV HIGH-RISK: HPV DNA High Risk: NOT DETECTED

## 2018-12-25 LAB — NO CULTURE INDICATED

## 2018-12-26 ENCOUNTER — Other Ambulatory Visit: Payer: Self-pay | Admitting: Internal Medicine

## 2019-01-21 ENCOUNTER — Other Ambulatory Visit: Payer: Self-pay

## 2019-01-21 ENCOUNTER — Encounter: Payer: Self-pay | Admitting: Gynecology

## 2019-01-21 MED ORDER — ESCITALOPRAM OXALATE 20 MG PO TABS: 20.0000 mg | ORAL_TABLET | Freq: Every day | ORAL | 0 refills | Status: DC

## 2019-03-15 ENCOUNTER — Other Ambulatory Visit: Payer: Self-pay | Admitting: Internal Medicine

## 2019-04-28 ENCOUNTER — Other Ambulatory Visit: Payer: Self-pay | Admitting: Internal Medicine

## 2019-05-05 ENCOUNTER — Encounter: Payer: BC Managed Care – PPO | Admitting: Internal Medicine

## 2019-05-25 ENCOUNTER — Ambulatory Visit (INDEPENDENT_AMBULATORY_CARE_PROVIDER_SITE_OTHER): Payer: BC Managed Care – PPO | Admitting: Internal Medicine

## 2019-05-25 ENCOUNTER — Other Ambulatory Visit: Payer: Self-pay

## 2019-05-25 ENCOUNTER — Encounter: Payer: Self-pay | Admitting: Internal Medicine

## 2019-05-25 VITALS — BP 112/70 | HR 54 | Temp 97.1°F | Ht 66.0 in | Wt 143.0 lb

## 2019-05-25 DIAGNOSIS — Z79899 Other long term (current) drug therapy: Secondary | ICD-10-CM

## 2019-05-25 DIAGNOSIS — Z23 Encounter for immunization: Secondary | ICD-10-CM | POA: Diagnosis not present

## 2019-05-25 DIAGNOSIS — F419 Anxiety disorder, unspecified: Secondary | ICD-10-CM

## 2019-05-25 DIAGNOSIS — R5383 Other fatigue: Secondary | ICD-10-CM | POA: Diagnosis not present

## 2019-05-25 DIAGNOSIS — K582 Mixed irritable bowel syndrome: Secondary | ICD-10-CM

## 2019-05-25 DIAGNOSIS — F329 Major depressive disorder, single episode, unspecified: Secondary | ICD-10-CM | POA: Diagnosis not present

## 2019-05-25 DIAGNOSIS — E7800 Pure hypercholesterolemia, unspecified: Secondary | ICD-10-CM | POA: Diagnosis not present

## 2019-05-25 DIAGNOSIS — Z0000 Encounter for general adult medical examination without abnormal findings: Secondary | ICD-10-CM | POA: Diagnosis not present

## 2019-05-25 LAB — COMPREHENSIVE METABOLIC PANEL
ALT: 24 U/L (ref 0–35)
AST: 22 U/L (ref 0–37)
Albumin: 4.5 g/dL (ref 3.5–5.2)
Alkaline Phosphatase: 59 U/L (ref 39–117)
BUN: 14 mg/dL (ref 6–23)
CO2: 30 mEq/L (ref 19–32)
Calcium: 9.2 mg/dL (ref 8.4–10.5)
Chloride: 102 mEq/L (ref 96–112)
Creatinine, Ser: 0.8 mg/dL (ref 0.40–1.20)
GFR: 75.2 mL/min (ref >60.00)
Glucose, Bld: 84 mg/dL (ref 70–99)
Potassium: 3.9 mEq/L (ref 3.5–5.1)
Sodium: 138 mEq/L (ref 135–145)
Total Bilirubin: 0.7 mg/dL (ref 0.2–1.2)
Total Protein: 7.2 g/dL (ref 6.0–8.3)

## 2019-05-25 LAB — LIPID PANEL
Cholesterol: 222 mg/dL — ABNORMAL HIGH (ref 0–200)
HDL: 63.7 mg/dL (ref >39.00)
LDL Cholesterol: 146 mg/dL — ABNORMAL HIGH (ref 0–99)
NonHDL: 158.44
Total CHOL/HDL Ratio: 3
Triglycerides: 62 mg/dL (ref 0.0–149.0)
VLDL: 12.4 mg/dL (ref 0.0–40.0)

## 2019-05-25 LAB — VITAMIN B12: Vitamin B-12: 913 pg/mL — ABNORMAL HIGH (ref 211–911)

## 2019-05-25 LAB — CBC
HCT: 40.5 % (ref 36.0–46.0)
Hemoglobin: 13.6 g/dL (ref 12.0–15.0)
MCHC: 33.5 g/dL (ref 30.0–36.0)
MCV: 93.5 fl (ref 78.0–100.0)
Platelets: 219 10*3/uL (ref 150.0–400.0)
RBC: 4.33 Mil/uL (ref 3.87–5.11)
RDW: 12.7 % (ref 11.5–15.5)
WBC: 5.1 10*3/uL (ref 4.0–10.5)

## 2019-05-25 LAB — VITAMIN D 25 HYDROXY (VIT D DEFICIENCY, FRACTURES): VITD: 63.44 ng/mL (ref 30.00–100.00)

## 2019-05-25 MED ORDER — ATORVASTATIN CALCIUM 40 MG PO TABS: 40.0000 mg | ORAL_TABLET | Freq: Every day | ORAL | 3 refills | Status: DC

## 2019-05-25 MED ORDER — ESCITALOPRAM OXALATE 20 MG PO TABS: 20.0000 mg | ORAL_TABLET | Freq: Every day | ORAL | 3 refills | Status: DC

## 2019-05-25 MED ORDER — BUPROPION HCL ER (XL) 150 MG PO TB24: 150.0000 mg | ORAL_TABLET | Freq: Every day | ORAL | 3 refills | Status: DC

## 2019-05-25 NOTE — Assessment & Plan Note (Addendum)
Continue high fiber diet and Mirilax as needed Will monitor

## 2019-05-25 NOTE — Patient Instructions (Signed)
Health Maintenance, Female Adopting a healthy lifestyle and getting preventive care are important in promoting health and wellness. Ask your health care provider about:  The right schedule for you to have regular tests and exams.  Things you can do on your own to prevent diseases and keep yourself healthy. What should I know about diet, weight, and exercise? Eat a healthy diet   Eat a diet that includes plenty of vegetables, fruits, low-fat dairy products, and lean protein.  Do not eat a lot of foods that are high in solid fats, added sugars, or sodium. Maintain a healthy weight Body mass index (BMI) is used to identify weight problems. It estimates body fat based on height and weight. Your health care provider can help determine your BMI and help you achieve or maintain a healthy weight. Get regular exercise Get regular exercise. This is one of the most important things you can do for your health. Most adults should:  Exercise for at least 150 minutes each week. The exercise should increase your heart rate and make you sweat (moderate-intensity exercise).  Do strengthening exercises at least twice a week. This is in addition to the moderate-intensity exercise.  Spend less time sitting. Even light physical activity can be beneficial. Watch cholesterol and blood lipids Have your blood tested for lipids and cholesterol at 53 years of age, then have this test every 5 years. Have your cholesterol levels checked more often if:  Your lipid or cholesterol levels are high.  You are older than 53 years of age.  You are at high risk for heart disease. What should I know about cancer screening? Depending on your health history and family history, you may need to have cancer screening at various ages. This may include screening for:  Breast cancer.  Cervical cancer.  Colorectal cancer.  Skin cancer.  Lung cancer. What should I know about heart disease, diabetes, and high blood  pressure? Blood pressure and heart disease  High blood pressure causes heart disease and increases the risk of stroke. This is more likely to develop in people who have high blood pressure readings, are of African descent, or are overweight.  Have your blood pressure checked: ? Every 3-5 years if you are 18-39 years of age. ? Every year if you are 40 years old or older. Diabetes Have regular diabetes screenings. This checks your fasting blood sugar level. Have the screening done:  Once every three years after age 40 if you are at a normal weight and have a low risk for diabetes.  More often and at a younger age if you are overweight or have a high risk for diabetes. What should I know about preventing infection? Hepatitis B If you have a higher risk for hepatitis B, you should be screened for this virus. Talk with your health care provider to find out if you are at risk for hepatitis B infection. Hepatitis C Testing is recommended for:  Everyone born from 1945 through 1965.  Anyone with known risk factors for hepatitis C. Sexually transmitted infections (STIs)  Get screened for STIs, including gonorrhea and chlamydia, if: ? You are sexually active and are younger than 53 years of age. ? You are older than 53 years of age and your health care provider tells you that you are at risk for this type of infection. ? Your sexual activity has changed since you were last screened, and you are at increased risk for chlamydia or gonorrhea. Ask your health care provider if   you are at risk.  Ask your health care provider about whether you are at high risk for HIV. Your health care provider may recommend a prescription medicine to help prevent HIV infection. If you choose to take medicine to prevent HIV, you should first get tested for HIV. You should then be tested every 3 months for as long as you are taking the medicine. Pregnancy  If you are about to stop having your period (premenopausal) and  you may become pregnant, seek counseling before you get pregnant.  Take 400 to 800 micrograms (mcg) of folic acid every day if you become pregnant.  Ask for birth control (contraception) if you want to prevent pregnancy. Osteoporosis and menopause Osteoporosis is a disease in which the bones lose minerals and strength with aging. This can result in bone fractures. If you are 65 years old or older, or if you are at risk for osteoporosis and fractures, ask your health care provider if you should:  Be screened for bone loss.  Take a calcium or vitamin D supplement to lower your risk of fractures.  Be given hormone replacement therapy (HRT) to treat symptoms of menopause. Follow these instructions at home: Lifestyle  Do not use any products that contain nicotine or tobacco, such as cigarettes, e-cigarettes, and chewing tobacco. If you need help quitting, ask your health care provider.  Do not use street drugs.  Do not share needles.  Ask your health care provider for help if you need support or information about quitting drugs. Alcohol use  Do not drink alcohol if: ? Your health care provider tells you not to drink. ? You are pregnant, may be pregnant, or are planning to become pregnant.  If you drink alcohol: ? Limit how much you use to 0-1 drink a day. ? Limit intake if you are breastfeeding.  Be aware of how much alcohol is in your drink. In the [place] one drink equals one 12 oz bottle of beer (355 mL), one 5 oz glass of wine (148 mL), or one 1 oz glass of hard liquor (44 mL). General instructions  Schedule regular health, dental, and eye exams.  Stay current with your vaccines.  Tell your health care provider if: ? You often feel depressed. ? You have ever been abused or do not feel safe at home. Summary  Adopting a healthy lifestyle and getting preventive care are important in promoting health and wellness.  Follow your health care provider's instructions about healthy  diet, exercising, and getting tested or screened for diseases.  Follow your health care provider's instructions on monitoring your cholesterol and blood pressure. This information is not intended to replace advice given to you by your health care provider. Make sure you discuss any questions you have with your health care provider. Document Revised: 04/09/2018 Document Reviewed: 04/09/2018 Elsevier Patient Education  2020 [employer]  

## 2019-05-25 NOTE — Assessment & Plan Note (Addendum)
CMET and Lipid profile today Encouraged her to consume a low fat diet Continue Atorvastatin for now, refilled today

## 2019-05-25 NOTE — Progress Notes (Signed)
Subjective:    Patient ID: Amanda Parrish, female    DOB: 11-25-1965, 54 y.o.   MRN: [id number]  HPI  Pt presents to the clinic today for her annual exam. She is also due to follow up chronic conditions.  Anxiety and Depression: Chronic, managed well on Escitalopram and Wellbutrin. She takes Clonazepam about once per month. She is not currently seeing a therapist. She denies SI/HI.  HLD: Her last LDL was 159, 02/2018. She reports mild myalgias on Atorvastatin. She tries to consume a low fat diet.   IBS: Mainly constipation. She consumes a high fiber diet and takes Mirilax as needed with good relief of symptoms.   Flu: 01/2018 Tetanus: 09/2009 Pap Smear: 11/2021 Mammogram: 05/2018 Colon Screening: 08/2015 Vision Screening: annually Dentist: biannually  Diet: She eats meat with some fruits and veggies. She consumes a low fat and high fiber diet. Avoids fried foods. She drinks mostly water, coffee, and vitamin B energy drinks.  Exercise: She walks twice a week for 30 minutes each.   Review of Systems      Past Medical History:  Diagnosis Date  . Allergy   . Anxiety   . Arthritis    hands and feet and hips   . Depression   . Hyperlipidemia   . IBS (irritable bowel syndrome)     Current Outpatient Medications  Medication Sig Dispense Refill  . atorvastatin (LIPITOR) 40 MG tablet Take 1 tablet (40 mg total) by mouth daily at 6 PM. 90 tablet 0  . BIOTIN PO Take by mouth.    Marland Kitchen buPROPion (WELLBUTRIN XL) 150 MG 24 hr tablet Take 1 tablet (150 mg total) by mouth daily. 90 tablet 0  . clonazePAM (KLONOPIN) 1 MG tablet Take 1 tablet (1 mg total) by mouth daily as needed for anxiety. 30 tablet 1  . escitalopram (LEXAPRO) 20 MG tablet TAKE 1 TABLET BY MOUTH ONCE DAILY -  NEEDS  TO  SCHEDULE  PHYSICAL 90 tablet 0  . metroNIDAZOLE (FLAGYL) 500 MG tablet Take 1 tablet (500 mg total) by mouth 2 (two) times daily. 14 tablet 0  . VITAMIN D PO Take by mouth.     No current  facility-administered medications for this visit.    No Known Allergies  Family History  Problem Relation Age of Onset  . Heart disease Father   . Colon polyps Mother   . Cancer Paternal Grandfather        Prostate  . Prostate cancer Paternal Grandfather   . Colon cancer Maternal Aunt   . Breast cancer Paternal Aunt 50  . Breast cancer Paternal Grandmother 77  . Rectal cancer Neg Hx   . Stomach cancer Neg Hx     Social History   Socioeconomic History  . Marital status: Married    Spouse name: Not on file  . Number of children: Not on file  . Years of education: Not on file  . Highest education level: Not on file  Occupational History  . Not on file  Tobacco Use  . Smoking status: Former Games developer  . Smokeless tobacco: Never Used  Substance and Sexual Activity  . Alcohol use: Yes    Alcohol/week: 0.0 standard drinks    Comment: Social  . Drug use: No  . Sexual activity: Yes    Birth control/protection: Surgical    Comment: BTL-1st intercourse 54 yo-More than 5 partners  Other Topics Concern  . Not on file  Social History Narrative  . Not on  file   Social Determinants of Health   Financial Resource Strain:   . Difficulty of Paying Living Expenses: Not on file  Food Insecurity:   . Worried About Programme researcher, broadcasting/film/video in the Last Year: Not on file  . Ran Out of Food in the Last Year: Not on file  Transportation Needs:   . Lack of Transportation (Medical): Not on file  . Lack of Transportation (Non-Medical): Not on file  Physical Activity:   . Days of Exercise per Week: Not on file  . Minutes of Exercise per Session: Not on file  Stress:   . Feeling of Stress : Not on file  Social Connections:   . Frequency of Communication with Friends and Family: Not on file  . Frequency of Social Gatherings with Friends and Family: Not on file  . Attends Religious Services: Not on file  . Active Member of Clubs or Organizations: Not on file  . Attends Banker  Meetings: Not on file  . Marital Status: Not on file  Intimate Partner Violence:   . Fear of Current or Ex-Partner: Not on file  . Emotionally Abused: Not on file  . Physically Abused: Not on file  . Sexually Abused: Not on file     Constitutional: Denies fever, malaise, fatigue, headache or abrupt weight changes.  HEENT: Pt reports dry eyes. Denies eye pain, eye redness, ear pain, ringing in the ears, wax buildup, runny nose, nasal congestion, bloody nose, or sore throat. Respiratory: Denies difficulty breathing, shortness of breath, cough or sputum production.   Cardiovascular: Denies chest pain, chest tightness, palpitations or swelling in the hands or feet.  Gastrointestinal: Pt reports constipation. Denies abdominal pain, bloating, diarrhea or blood in the stool.  GU: Denies urgency, frequency, pain with urination, burning sensation, blood in urine, odor or discharge. Musculoskeletal: Denies decrease in range of motion, difficulty with gait, muscle pain or joint pain and swelling.  Skin: Denies redness, rashes, lesions or ulcercations.  Neurological: Denies dizziness, difficulty with memory, difficulty with speech or problems with balance and coordination.  Psych: Pt has a history of anxiety and depression. Denies SI/HI.  No other specific complaints in a complete review of systems (except as listed in HPI above).  Objective:   Physical Exam  BP 112/70   Pulse (!) 54   Temp (!) 97.1 F (36.2 C) (Temporal)   Ht 5\' 6"  (1.676 m)   Wt 143 lb (64.9 kg)   SpO2 98%   BMI 23.08 kg/m   Wt Readings from Last 3 Encounters:  12/24/18 134 lb (60.8 kg)  03/25/18 158 lb (71.7 kg)  05/20/17 157 lb (71.2 kg)    General: Appears her stated age, well developed, well nourished in NAD. Skin: Warm, dry and intact. No rashes, lesions or ulcerations noted. HEENT: Head: normal shape and size; Eyes: sclera white, no icterus, conjunctiva pink, PERRLA and EOMs intact. Cardiovascular: Slow rate  and normal rhythm. S1,S2 noted.  No murmur, rubs or gallops noted. No JVD or BLE edema. No carotid bruits noted. Pulmonary/Chest: Normal effort and positive vesicular breath sounds. No respiratory distress. No wheezes, rales or ronchi noted.  Abdomen: Soft and nontender. Normal bowel sounds. No distention or masses noted. Liver, spleen and kidneys non palpable. Musculoskeletal: Strength 5/5 BUE/BLE. No difficulty with gait.  Neurological: Alert and oriented. Cranial nerves II-XII grossly intact. Coordination normal.  Psychiatric: Flat affect today. Judgment and thought content normal.    BMET    Component Value Date/Time  NA 137 03/25/2018 1533   K 4.0 03/25/2018 1533   CL 100 03/25/2018 1533   CO2 28 03/25/2018 1533   GLUCOSE 84 03/25/2018 1533   BUN 13 03/25/2018 1533   CREATININE 0.92 03/25/2018 1533   CREATININE 0.96 04/19/2017 1510   CALCIUM 9.6 03/25/2018 1533    Lipid Panel     Component Value Date/Time   CHOL 222 (H) 03/25/2018 1533   TRIG 62.0 03/25/2018 1533   HDL 50.80 03/25/2018 1533   CHOLHDL 4 03/25/2018 1533   VLDL 12.4 03/25/2018 1533   LDLCALC 159 (H) 03/25/2018 1533    CBC    Component Value Date/Time   WBC 6.2 03/25/2018 1533   RBC 4.50 03/25/2018 1533   HGB 14.1 03/25/2018 1533   HCT 40.8 03/25/2018 1533   HCT 41 10/16/2017 0000   PLT 264.0 03/25/2018 1533   MCV 90.7 03/25/2018 1533   MCH 31.4 04/19/2017 1510   MCHC 34.5 03/25/2018 1533   RDW 12.9 03/25/2018 1533   LYMPHSABS 1,947 04/19/2017 1510   MONOABS 0.4 06/21/2015 0831   EOSABS 81 04/19/2017 1510   BASOSABS 50 04/19/2017 1510    Hgb A1C No results found for: HGBA1C         Assessment & Plan:   Preventative Health Maintenance:  Flu shot today Tetanus today Pap smear UTD Mammogram UTD Colon screening UTD Encouraged her to consume a balanced diet and exercise regimen Advised her to see an eye doctor and dentist annually Will check CBC, CMET, Lipid, Vit D, and Vit B12  today  RTC in 1 year, sooner if needed Webb Silversmith, NP This visit occurred during the SARS-CoV-2 public health emergency.  Safety protocols were in place, including screening questions prior to the visit, additional usage of staff PPE, and extensive cleaning of exam room while observing appropriate contact time as indicated for disinfecting solutions.

## 2019-05-25 NOTE — Assessment & Plan Note (Signed)
Continue Escitalopram and Wellbutrin for now, refilled today Support offered today Refilled Clonazepam- CSA and UDS today

## 2019-05-25 NOTE — Addendum Note (Signed)
Addended by: Lorre Munroe on: 05/25/2019 11:43 AM   Modules accepted: Orders

## 2019-05-26 NOTE — Addendum Note (Signed)
Addended by: Roena Malady on: 05/26/2019 03:42 PM   Modules accepted: Orders

## 2019-07-10 ENCOUNTER — Ambulatory Visit: Payer: BC Managed Care – PPO | Attending: Internal Medicine

## 2019-07-10 ENCOUNTER — Other Ambulatory Visit: Payer: Self-pay

## 2019-07-10 DIAGNOSIS — Z23 Encounter for immunization: Secondary | ICD-10-CM

## 2019-07-10 NOTE — Progress Notes (Signed)
   Covid-19 Vaccination Clinic  Name:  Amanda Parrish    MRN: [id number] DOB: 1965/07/03  07/10/2019  Ms. Amanda Parrish was observed post Covid-19 immunization for 15 minutes without incident. She was provided with Vaccine Information Sheet and instruction to access the V-Safe system.   Ms. Amanda Parrish was instructed to call 911 with any severe reactions post vaccine: Marland Kitchen Difficulty breathing  . Swelling of face and throat  . A fast heartbeat  . A bad rash all over body  . Dizziness and weakness   Immunizations Administered    Name Date Dose VIS Date Route   Pfizer COVID-19 Vaccine 07/10/2019  9:00 AM 0.3 mL 04/10/2019 Intramuscular   Manufacturer: [employer], [employer]   Lot: [id number]   NDC: [address]-7

## 2019-08-04 ENCOUNTER — Other Ambulatory Visit: Payer: Self-pay

## 2019-08-04 ENCOUNTER — Ambulatory Visit: Payer: BC Managed Care – PPO | Attending: Internal Medicine

## 2019-08-04 DIAGNOSIS — Z23 Encounter for immunization: Secondary | ICD-10-CM

## 2019-08-04 NOTE — Progress Notes (Signed)
   Covid-19 Vaccination Clinic  Name:  [name]    MRN: [id number] DOB: 09/03/1965  08/04/2019  Ms. Amanda Parrish was observed post Covid-19 immunization for 15 minutes without incident. She was provided with Vaccine Information Sheet and instruction to access the V-Safe system.   Ms. Amanda Parrish was instructed to call 911 with any severe reactions post vaccine: Marland Kitchen Difficulty breathing  . Swelling of face and throat  . A fast heartbeat  . A bad rash all over body  . Dizziness and weakness   Immunizations Administered    Name Date Dose VIS Date Route   Pfizer COVID-19 Vaccine 08/04/2019  8:57 AM 0.3 mL 04/10/2019 Intramuscular   Manufacturer: [employer], [employer]   Lot: [phone] [id number]   NDC: [address]-6

## 2019-09-29 ENCOUNTER — Encounter: Payer: Self-pay | Admitting: Internal Medicine

## 2019-09-29 ENCOUNTER — Ambulatory Visit: Payer: BC Managed Care – PPO | Admitting: Internal Medicine

## 2019-09-29 ENCOUNTER — Other Ambulatory Visit: Payer: Self-pay

## 2019-09-29 VITALS — BP 118/78 | HR 65 | Temp 97.0°F | Wt 147.0 lb

## 2019-09-29 DIAGNOSIS — R41840 Attention and concentration deficit: Secondary | ICD-10-CM | POA: Diagnosis not present

## 2019-09-29 MED ORDER — BUPROPION HCL ER (XL) 150 MG PO TB24: 300.0000 mg | ORAL_TABLET | Freq: Every day | ORAL | 0 refills | Status: DC

## 2019-09-29 NOTE — Patient Instructions (Signed)

## 2019-09-29 NOTE — Progress Notes (Signed)
Subjective:    Patient ID: Amanda Parrish, female    DOB: 08-09-1965, 54 y.o.   MRN: [id number]  HPI  Patient presents to the clinic today requesting to be restarted on ADD meds. She reports over the last few months after getting a new promotion at work, she has had issues with not being able to focus, inattention and just feeling overwhelmed like she is unable to handle the job she been given. She reports she was diagnosed with ADD many years ago by psychologist. She does not have access to this paperwork. She has been on Adderall in the past and would like to get restarted on this today.  Review of Systems      Past Medical History:  Diagnosis Date  . Allergy   . Anxiety   . Arthritis    hands and feet and hips   . Depression   . Hyperlipidemia   . IBS (irritable bowel syndrome)     Current Outpatient Medications  Medication Sig Dispense Refill  . atorvastatin (LIPITOR) 40 MG tablet Take 1 tablet (40 mg total) by mouth daily at 6 PM. 90 tablet 3  . buPROPion (WELLBUTRIN XL) 150 MG 24 hr tablet Take 2 tablets (300 mg total) by mouth daily. 180 tablet 0  . escitalopram (LEXAPRO) 20 MG tablet Take 1 tablet (20 mg total) by mouth at bedtime. 90 tablet 3  . VITAMIN D PO Take 10,000 Units by mouth every other day.     . clonazePAM (KLONOPIN) 1 MG tablet Take 1 tablet (1 mg total) by mouth daily as needed for anxiety. (Patient not taking: Reported on 09/29/2019) 30 tablet 1   No current facility-administered medications for this visit.    No Known Allergies  Family History  Problem Relation Age of Onset  . Heart disease Father   . Colon polyps Mother   . Cancer Paternal Grandfather        Prostate  . Prostate cancer Paternal Grandfather   . Colon cancer Maternal Aunt   . Breast cancer Paternal Aunt 16  . Breast cancer Paternal Grandmother 78  . Rectal cancer Neg Hx   . Stomach cancer Neg Hx     Social History   Socioeconomic History  . Marital status: Married   Spouse name: Not on file  . Number of children: Not on file  . Years of education: Not on file  . Highest education level: Not on file  Occupational History  . Not on file  Tobacco Use  . Smoking status: Former Research scientist (life sciences)  . Smokeless tobacco: Never Used  Substance and Sexual Activity  . Alcohol use: Yes    Alcohol/week: 0.0 standard drinks    Comment: Social  . Drug use: No  . Sexual activity: Yes    Birth control/protection: Surgical    Comment: BTL-1st intercourse 54 yo-More than 5 partners  Other Topics Concern  . Not on file  Social History Narrative  . Not on file   Social Determinants of Health   Financial Resource Strain:   . Difficulty of Paying Living Expenses:   Food Insecurity:   . Worried About Charity fundraiser in the Last Year:   . Arboriculturist in the Last Year:   Transportation Needs:   . Film/video editor (Medical):   Marland Kitchen Lack of Transportation (Non-Medical):   Physical Activity:   . Days of Exercise per Week:   . Minutes of Exercise per Session:   Stress:   .  Feeling of Stress :   Social Connections:   . Frequency of Communication with Friends and Family:   . Frequency of Social Gatherings with Friends and Family:   . Attends Religious Services:   . Active Member of Clubs or Organizations:   . Attends Banker Meetings:   Marland Kitchen Marital Status:   Intimate Partner Violence:   . Fear of Current or Ex-Partner:   . Emotionally Abused:   Marland Kitchen Physically Abused:   . Sexually Abused:      Constitutional: Denies fever, malaise, fatigue, headache or abrupt weight changes.  Respiratory: Denies difficulty breathing, shortness of breath, cough or sputum production.   Cardiovascular: Denies chest pain, chest tightness, palpitations or swelling in the hands or feet.  Neurological: Patient reports inattention, difficulty focusing. Denies dizziness, difficulty with memory, difficulty with speech or problems with balance and coordination.    No other  specific complaints in a complete review of systems (except as listed in HPI above).  Objective:   Physical Exam  BP 118/78   Pulse 65   Temp (!) 97 F (36.1 C) (Temporal)   Wt 147 lb (66.7 kg)   SpO2 98%   BMI 23.73 kg/m  Wt Readings from Last 3 Encounters:  09/29/19 147 lb (66.7 kg)  05/25/19 143 lb (64.9 kg)  12/24/18 134 lb (60.8 kg)    General: Appears her stated age, well developed, well nourished in NAD. Cardiovascular: Normal rate. Pulmonary/Chest: Normal effort. Neurological: Alert and oriented.  Psychiatric: Mood and affect normal. Behavior is normal. Judgment and thought content normal.    BMET    Component Value Date/Time   NA 138 05/25/2019 1137   K 3.9 05/25/2019 1137   CL 102 05/25/2019 1137   CO2 30 05/25/2019 1137   GLUCOSE 84 05/25/2019 1137   BUN 14 05/25/2019 1137   CREATININE 0.80 05/25/2019 1137   CREATININE 0.96 04/19/2017 1510   CALCIUM 9.2 05/25/2019 1137    Lipid Panel     Component Value Date/Time   CHOL 222 (H) 05/25/2019 1137   TRIG 62.0 05/25/2019 1137   HDL 63.70 05/25/2019 1137   CHOLHDL 3 05/25/2019 1137   VLDL 12.4 05/25/2019 1137   LDLCALC 146 (H) 05/25/2019 1137    CBC    Component Value Date/Time   WBC 5.1 05/25/2019 1137   RBC 4.33 05/25/2019 1137   HGB 13.6 05/25/2019 1137   HCT 40.5 05/25/2019 1137   HCT 41 10/16/2017 0000   PLT 219.0 05/25/2019 1137   MCV 93.5 05/25/2019 1137   MCH 31.4 04/19/2017 1510   MCHC 33.5 05/25/2019 1137   RDW 12.7 05/25/2019 1137   LYMPHSABS 1,947 04/19/2017 1510   MONOABS 0.4 06/21/2015 0831   EOSABS 81 04/19/2017 1510   BASOSABS 50 04/19/2017 1510    Hgb A1C No results found for: HGBA1C          Assessment & Plan:  Inattention:  We will increase Wellbutrin to 300 mg p.o. daily Could consider going up to 450 mg p.o. daily for off label use Referral to psychology for formal ADD testing  We will follow-up after psych eval, discussed findings and treatment  options Nicki Reaper, NP This visit occurred during the SARS-CoV-2 public health emergency.  Safety protocols were in place, including screening questions prior to the visit, additional usage of staff PPE, and extensive cleaning of exam room while observing appropriate contact time as indicated for disinfecting solutions.

## 2019-10-17 ENCOUNTER — Other Ambulatory Visit: Payer: Self-pay

## 2019-10-17 NOTE — Telephone Encounter (Signed)
Pt had CPE 05/2019, Transfer from Dr Dayton Martes... you have not filled this medication yet, CSA signed but no UDS...Marland Kitchen please advise

## 2019-10-19 MED ORDER — CLONAZEPAM 1 MG PO TABS: 1.0000 mg | ORAL_TABLET | Freq: Every day | ORAL | 0 refills | Status: DC | PRN

## 2019-12-21 ENCOUNTER — Ambulatory Visit: Payer: BC Managed Care – PPO | Admitting: Dermatology

## 2020-01-26 ENCOUNTER — Telehealth: Payer: Self-pay

## 2020-01-26 NOTE — Telephone Encounter (Signed)
I was unable to speak with pt; left v/m requesting cb; [place] allows [place] not to speak with anyone else. FYI to Pamala Hurry NP, Melanie CMA and [facility] LPN.

## 2020-01-26 NOTE — Telephone Encounter (Signed)
[facility] Day - Client TELEPHONE ADVICE RECORD [facility] Patient Name: DETTA MELLIN Gender: Female DOB: 11-21-1965 Age: 54 Y 1 M 8 D Return Phone Number: [phone] (Primary) Address: City/State/Zip: [place] [address] Client [facility] Day - Client Client Site [facility] - Day Physician Nicki Reaper - NP Contact Type Call Who Is Calling Patient / Member / Family / Caregiver Call Type Triage / Clinical Relationship To Patient Self Return Phone Number [phone] (Primary) Chief Complaint Sore Throat Reason for Call Symptomatic / Request for Health Information Initial Comment Caller says she may have step throat or a UTI, has sore throat Translation No Disp. Time Lamount Cohen Time) Disposition Final User 01/25/2020 4:13:27 PM Attempt made - message left Newhart, RN, Beth 01/25/2020 4:20:56 PM Attempt made - message left Newhart, RN, Beth 01/25/2020 4:26:11 PM Attempt made - message left Newhart, RN, Waynetta Sandy 01/25/2020 4:30:59 PM FINAL ATTEMPT MADE - message left Yes Newhart, RN, [employer]

## 2020-01-26 NOTE — Telephone Encounter (Signed)
May be able to see her carside at 3:45

## 2020-01-26 NOTE — Telephone Encounter (Signed)
Virtual 9/30

## 2020-01-28 ENCOUNTER — Encounter: Payer: Self-pay | Admitting: Nurse Practitioner

## 2020-01-28 ENCOUNTER — Ambulatory Visit (INDEPENDENT_AMBULATORY_CARE_PROVIDER_SITE_OTHER): Payer: BC Managed Care – PPO | Admitting: Nurse Practitioner

## 2020-01-28 ENCOUNTER — Other Ambulatory Visit: Payer: Self-pay

## 2020-01-28 ENCOUNTER — Telehealth: Payer: BC Managed Care – PPO | Admitting: Internal Medicine

## 2020-01-28 VITALS — BP 126/80

## 2020-01-28 DIAGNOSIS — B373 Candidiasis of vulva and vagina: Secondary | ICD-10-CM | POA: Diagnosis not present

## 2020-01-28 DIAGNOSIS — R350 Frequency of micturition: Secondary | ICD-10-CM | POA: Diagnosis not present

## 2020-01-28 DIAGNOSIS — N898 Other specified noninflammatory disorders of vagina: Secondary | ICD-10-CM | POA: Diagnosis not present

## 2020-01-28 DIAGNOSIS — B3731 Acute candidiasis of vulva and vagina: Secondary | ICD-10-CM

## 2020-01-28 LAB — URINALYSIS, COMPLETE W/RFL CULTURE
Bacteria, UA: NONE SEEN /HPF
Bilirubin Urine: NEGATIVE
Glucose, UA: NEGATIVE
Hgb urine dipstick: NEGATIVE
Hyaline Cast: NONE SEEN /LPF
Ketones, ur: NEGATIVE
Leukocyte Esterase: NEGATIVE
Nitrites, Initial: NEGATIVE
Protein, ur: NEGATIVE
RBC / HPF: NONE SEEN /HPF (ref 0–2)
Specific Gravity, Urine: 1.02 (ref 1.001–1.03)
WBC, UA: NONE SEEN /HPF (ref 0–5)
pH: 6 (ref 5.0–8.0)

## 2020-01-28 LAB — WET PREP FOR TRICH, YEAST, CLUE

## 2020-01-28 LAB — NO CULTURE INDICATED

## 2020-01-28 MED ORDER — FLUCONAZOLE 150 MG PO TABS: 150.0000 mg | ORAL_TABLET | Freq: Once | ORAL | 0 refills | Status: AC

## 2020-01-28 NOTE — Patient Instructions (Signed)
Vaginal Yeast Infection, Adult  Vaginal yeast infection is a condition that causes vaginal discharge as well as soreness, swelling, and redness (inflammation) of the vagina. This is a common condition. Some women get this infection frequently. What are the causes? This condition is caused by a change in the normal balance of the yeast (candida) and bacteria that live in the vagina. This change causes an overgrowth of yeast, which causes the inflammation. What increases the risk? The condition is more likely to develop in women who:  Take antibiotic medicines.  Have diabetes.  Take birth control pills.  Are pregnant.  Douche often.  Have a weak body defense system (immune system).  Have been taking steroid medicines for a long time.  Frequently wear tight clothing. What are the signs or symptoms? Symptoms of this condition include:  White, thick, creamy vaginal discharge.  Swelling, itching, redness, and irritation of the vagina. The lips of the vagina (vulva) may be affected as well.  Pain or a burning feeling while urinating.  Pain during sex. How is this diagnosed? This condition is diagnosed based on:  Your medical history.  A physical exam.  A pelvic exam. Your health care provider will examine a sample of your vaginal discharge under a microscope. Your health care provider may send this sample for testing to confirm the diagnosis. How is this treated? This condition is treated with medicine. Medicines may be over-the-counter or prescription. You may be told to use one or more of the following:  Medicine that is taken by mouth (orally).  Medicine that is applied as a cream (topically).  Medicine that is inserted directly into the vagina (suppository). Follow these instructions at home:  Lifestyle  Do not have sex until your health care provider approves. Tell your sex partner that you have a yeast infection. That person should go to his or her health care  provider and ask if they should also be treated.  Do not wear tight clothes, such as pantyhose or tight pants.  Wear breathable cotton underwear. General instructions  Take or apply over-the-counter and prescription medicines only as told by your health care provider.  Eat more yogurt. This may help to keep your yeast infection from returning.  Do not use tampons until your health care provider approves.  Try taking a sitz bath to help with discomfort. This is a warm water bath that is taken while you are sitting down. The water should only come up to your hips and should cover your buttocks. Do this 3-4 times per day or as told by your health care provider.  Do not douche.  If you have diabetes, keep your blood sugar levels under control.  Keep all follow-up visits as told by your health care provider. This is important. Contact a health care provider if:  You have a fever.  Your symptoms go away and then return.  Your symptoms do not get better with treatment.  Your symptoms get worse.  You have new symptoms.  You develop blisters in or around your vagina.  You have blood coming from your vagina and it is not your menstrual period.  You develop pain in your abdomen. Summary  Vaginal yeast infection is a condition that causes discharge as well as soreness, swelling, and redness (inflammation) of the vagina.  This condition is treated with medicine. Medicines may be over-the-counter or prescription.  Take or apply over-the-counter and prescription medicines only as told by your health care provider.  Do not douche.   Do not have sex or use tampons until your health care provider approves.  Contact a health care provider if your symptoms do not get better with treatment or your symptoms go away and then return. This information is not intended to replace advice given to you by your health care provider. Make sure you discuss any questions you have with your health care  provider. Document Revised: 11/14/2018 Document Reviewed: 09/02/2017 Elsevier Patient Education  2020 Elsevier Inc.  

## 2020-01-28 NOTE — Progress Notes (Signed)
   Acute Office Visit  Subjective:    Patient ID: Amanda Parrish, female    DOB: [birth year], 54 y.o.   MRN: [id number]   HPI 54 y.o. presents today for urinary frequency, urgency, odor, and lower abdominal pressure that started a week ago. She also had a couple days of brown discharge without odor or vaginal itching. Has not had a cycle in years due to ablation. She was on an antibiotic 1 month ago for a sinus infection.   Review of Systems  Constitutional: Negative.   Gastrointestinal: Positive for abdominal pain (lower abdominal pressure).  Genitourinary: Positive for frequency, urgency and vaginal discharge. Negative for dysuria, flank pain, hematuria, vaginal bleeding and vaginal pain.       Objective:    Physical Exam Constitutional:      Appearance: Normal appearance.  Abdominal:     Tenderness: There is no right CVA tenderness or left CVA tenderness.  Genitourinary:    General: Normal vulva.     Vagina: Vaginal discharge (yellow) present. No erythema or bleeding.     BP 126/80 (BP Location: Right Arm, Patient Position: Sitting, Cuff Size: Normal)  Wt Readings from Last 3 Encounters:  09/29/19 147 lb (66.7 kg)  05/25/19 143 lb (64.9 kg)  12/24/18 134 lb (60.8 kg)   UA negative Wet prep + yeast     Assessment & Plan:   Problem List Items Addressed This Visit    None    Visit Diagnoses    Vaginal candidiasis    -  Primary   Relevant Medications   fluconazole (DIFLUCAN) 150 MG tablet   Urinary frequency       Relevant Orders   Urinalysis,Complete w/RFL Culture   Vaginal discharge       Relevant Orders   WET PREP FOR TRICH, YEAST, CLUE     Plan: Wet prep positive for yeast.  Diflucan 150 mg today and repeat dose in 3 days for a total of 2 doses.  UA unremarkable.  Return to office if symptoms worsen or do not improve.  She is agreeable to plan.     Olivia Mackie [facility], 9:04 AM 01/28/2020

## 2020-02-17 ENCOUNTER — Other Ambulatory Visit: Payer: Self-pay | Admitting: Internal Medicine

## 2020-02-17 NOTE — Telephone Encounter (Signed)
Last filled 10/19/2019.... please advise  

## 2020-06-01 ENCOUNTER — Other Ambulatory Visit: Payer: Self-pay | Admitting: Internal Medicine

## 2020-06-02 ENCOUNTER — Encounter: Payer: Self-pay | Admitting: Internal Medicine

## 2020-06-28 ENCOUNTER — Other Ambulatory Visit: Payer: Self-pay | Admitting: Internal Medicine

## 2020-07-20 ENCOUNTER — Ambulatory Visit: Payer: BC Managed Care – PPO | Admitting: Internal Medicine

## 2020-12-26 ENCOUNTER — Other Ambulatory Visit: Payer: Self-pay | Admitting: Internal Medicine

## 2020-12-26 NOTE — Telephone Encounter (Signed)
Requested medication (s) are due for refill today: yes   Requested medication (s) are on the active medication list: yes   Last refill:  06/02/20 #90 0 refills   Future visit scheduled: yes with [facility] ([facility]) appt 01/12/21  Notes to clinic:  do you want to refill Rx? No previous encounter with this patient. Appt scheduled for 01/12/21      Requested Prescriptions  Pending Prescriptions Disp Refills   atorvastatin (LIPITOR) 40 MG tablet [Pharmacy Med Name: ATORVASTATIN 40 MG TABLET] 90 tablet 0    Sig: Take 1 tablet (40 mg total) by mouth daily at 6 PM. PT NEEDS TO SCHEDULE PHYSICAL PER MD     There is no refill protocol information for this order

## 2020-12-28 ENCOUNTER — Other Ambulatory Visit: Payer: Self-pay | Admitting: Internal Medicine

## 2020-12-28 NOTE — Telephone Encounter (Signed)
Requested medication (s) are due for refill today: expired medication  Requested medication (s) are on the active medication list: yes  Last refill:  09/29/19 #180 0 refills  Future visit scheduled: yes future in 01/12/21  Notes to clinic:  expired medications      Requested Prescriptions  Pending Prescriptions Disp Refills   buPROPion (WELLBUTRIN XL) 150 MG 24 hr tablet [Pharmacy Med Name: BUPROPION HCL XL 150 MG TABLET] 180 tablet 0    Sig: Take 2 tablets (300 mg total) by mouth daily.     There is no refill protocol information for this order

## 2020-12-28 NOTE — Telephone Encounter (Signed)
Patient called in to request the refill from [facility] on her Bupropion please

## 2021-01-04 ENCOUNTER — Encounter: Payer: Self-pay | Admitting: Internal Medicine

## 2021-01-05 ENCOUNTER — Encounter: Payer: Self-pay | Admitting: Internal Medicine

## 2021-01-12 ENCOUNTER — Ambulatory Visit (INDEPENDENT_AMBULATORY_CARE_PROVIDER_SITE_OTHER): Payer: BC Managed Care – PPO | Admitting: Internal Medicine

## 2021-01-12 ENCOUNTER — Other Ambulatory Visit: Payer: Self-pay

## 2021-01-12 ENCOUNTER — Encounter: Payer: Self-pay | Admitting: Internal Medicine

## 2021-01-12 VITALS — BP 102/54 | HR 58 | Temp 97.5°F | Resp 17 | Ht 66.0 in | Wt 153.2 lb

## 2021-01-12 DIAGNOSIS — Z1159 Encounter for screening for other viral diseases: Secondary | ICD-10-CM

## 2021-01-12 DIAGNOSIS — K581 Irritable bowel syndrome with constipation: Secondary | ICD-10-CM

## 2021-01-12 DIAGNOSIS — Z1231 Encounter for screening mammogram for malignant neoplasm of breast: Secondary | ICD-10-CM

## 2021-01-12 DIAGNOSIS — F419 Anxiety disorder, unspecified: Secondary | ICD-10-CM

## 2021-01-12 DIAGNOSIS — L853 Xerosis cutis: Secondary | ICD-10-CM | POA: Diagnosis not present

## 2021-01-12 DIAGNOSIS — E7800 Pure hypercholesterolemia, unspecified: Secondary | ICD-10-CM | POA: Diagnosis not present

## 2021-01-12 DIAGNOSIS — Z0001 Encounter for general adult medical examination with abnormal findings: Secondary | ICD-10-CM

## 2021-01-12 DIAGNOSIS — F32A Depression, unspecified: Secondary | ICD-10-CM

## 2021-01-12 DIAGNOSIS — R5383 Other fatigue: Secondary | ICD-10-CM

## 2021-01-12 DIAGNOSIS — Z114 Encounter for screening for human immunodeficiency virus [HIV]: Secondary | ICD-10-CM

## 2021-01-12 MED ORDER — ESCITALOPRAM OXALATE 20 MG PO TABS: 20.0000 mg | ORAL_TABLET | Freq: Every day | ORAL | 3 refills | Status: DC

## 2021-01-12 MED ORDER — ATORVASTATIN CALCIUM 40 MG PO TABS: ORAL_TABLET | ORAL | 3 refills | Status: DC

## 2021-01-12 MED ORDER — CLONAZEPAM 1 MG PO TABS: 1.0000 mg | ORAL_TABLET | Freq: Every day | ORAL | 0 refills | Status: DC | PRN

## 2021-01-12 MED ORDER — BUPROPION HCL ER (XL) 150 MG PO TB24: 300.0000 mg | ORAL_TABLET | Freq: Every day | ORAL | 3 refills | Status: DC

## 2021-01-12 NOTE — Assessment & Plan Note (Signed)
Stable on her current dose of Escitalopram, Bupropion and Clonazepam Support offered

## 2021-01-12 NOTE — Assessment & Plan Note (Signed)
C-Met and lipid profile today Encouraged her to consume a low-fat diet Continue Atorvastatin

## 2021-01-12 NOTE — Patient Instructions (Signed)

## 2021-01-12 NOTE — Progress Notes (Signed)
Subjective:    Patient ID: Amanda Parrish, female    DOB: 1965-12-02, 55 y.o.   MRN: [id number]  HPI  Patient presents the clinic today for her annual exam.  She is also due to follow-up chronic conditions.  IBS: Mainly constipation.  She consumes a high-fiber diet and takes MiraLAX as needed with good results.  Colonoscopy from 08/2015 reviewed.  HLD: Her last LDL was 146, triglycerides 62, 05/2019.  She denies myalgias on Atorvastatin.  She has been trying to consume a low-fat diet.  Anxiety and Depression: Chronic, managed on Escitalopram, Bupropion and Clonazepam.  She is not currently seeing a therapist.  She denies SI/HI.  Flu: 05/2019 Tetanus: 05/2019 COVID: Russell x3 Shingrix: Never Pap smear: 11/2018 Mammogram: 05/2018 Colon screening: 08/2015, 5 years Vision screening: annually Dentist: biannually  Diet: She does eat meat. She consumes fruits and veggies. She tries to avoid fried foods. She drinks mostly water, coffee Exercise: gym 1 hour 2 x week  Review of Systems     Past Medical History:  Diagnosis Date   Allergy    Anxiety    Arthritis    hands and feet and hips    Depression    Hyperlipidemia    IBS (irritable bowel syndrome)     Current Outpatient Medications  Medication Sig Dispense Refill   atorvastatin (LIPITOR) 40 MG tablet Take 1 tablet (40 mg total) by mouth daily at 6 PM. PT NEEDS TO SCHEDULE PHYSICAL PER MD 90 tablet 0   buPROPion (WELLBUTRIN XL) 150 MG 24 hr tablet Take 2 tablets (300 mg total) by mouth daily. 180 tablet 0   clonazePAM (KLONOPIN) 1 MG tablet Take 1 tablet (1 mg total) by mouth daily as needed for anxiety. 30 tablet 0   escitalopram (LEXAPRO) 20 MG tablet Take 1 tablet (20 mg total) by mouth at bedtime. 90 tablet 0   VITAMIN D PO Take 10,000 Units by mouth every other day.      No current facility-administered medications for this visit.    No Known Allergies  Family History  Problem Relation Age of Onset   Heart  disease Father    Colon polyps Mother    Cancer Paternal Grandfather        Prostate   Prostate cancer Paternal Grandfather    Colon cancer Maternal Aunt    Breast cancer Paternal Aunt 73   Breast cancer Paternal Grandmother 55   Rectal cancer Neg Hx    Stomach cancer Neg Hx     Social History   Socioeconomic History   Marital status: Married    Spouse name: Not on file   Number of children: Not on file   Years of education: Not on file   Highest education level: Not on file  Occupational History   Not on file  Tobacco Use   Smoking status: Former   Smokeless tobacco: Never  Vaping Use   Vaping Use: Never used  Substance and Sexual Activity   Alcohol use: Yes    Alcohol/week: 0.0 standard drinks    Comment: Social   Drug use: No   Sexual activity: Yes    Birth control/protection: Surgical    Comment: BTL-1st intercourse 55 yo-More than 5 partners  Other Topics Concern   Not on file  Social History Narrative   Not on file   Social Determinants of Health   Financial Resource Strain: Not on file  Food Insecurity: Not on file  Transportation Needs: Not on file  Physical Activity: Not on file  Stress: Not on file  Social Connections: Not on file  Intimate Partner Violence: Not on file     Constitutional: Pt reports fatigue. Denies fever, malaise, fatigue, headache or abrupt weight changes.  HEENT: Denies eye pain, eye redness, ear pain, ringing in the ears, wax buildup, runny nose, nasal congestion, bloody nose, or sore throat. Respiratory: Denies difficulty breathing, shortness of breath, cough or sputum production.   Cardiovascular: Denies chest pain, chest tightness, palpitations or swelling in the hands or feet.  Gastrointestinal: Patient reports constipation.  Denies abdominal pain, bloating, diarrhea or blood in the stool.  GU: Denies urgency, frequency, pain with urination, burning sensation, blood in urine, odor or discharge. Musculoskeletal: Denies  decrease in range of motion, difficulty with gait, muscle pain or joint pain and swelling.  Skin: Patient reports dry skin.  Denies redness, rashes, lesions or ulcercations.  Neurological: Denies dizziness, difficulty with memory, difficulty with speech or problems with balance and coordination.  Psych: Patient has a history of anxiety and depression.  Denies SI/HI.  No other specific complaints in a complete review of systems (except as listed in HPI above).  Objective:   Physical Exam BP (!) 102/54 (BP Location: Right Arm, Patient Position: Sitting, Cuff Size: Normal)   Pulse (!) 58   Temp (!) 97.5 F (36.4 C) (Temporal)   Resp 17   Ht _0  (1.676 m)   Wt 153 lb 3.2 oz (69.5 kg)   SpO2 100%   BMI 24.73 kg/m   Wt Readings from Last 3 Encounters:  09/29/19 147 lb (66.7 kg)  05/25/19 143 lb (64.9 kg)  12/24/18 134 lb (60.8 kg)    General: Appears their stated age, well developed, well nourished in NAD. Skin: Warm, very dry and intact.  HEENT: Head: normal shape and size; Eyes: sclera white and EOMs intact;  Neck:  Neck supple, trachea midline. No masses, lumps or thyromegaly present.  Cardiovascular: Bradycardic with normal rhythm. S1,S2 noted.  No murmur, rubs or gallops noted. No JVD or BLE edema. No carotid bruits noted. Pulmonary/Chest: Normal effort and positive vesicular breath sounds. No respiratory distress. No wheezes, rales or ronchi noted.  Abdomen: Soft and nontender. Normal bowel sounds. No distention or masses noted. Liver, spleen and kidneys non palpable. Musculoskeletal: Strength 5/5 BUE/BLE.  No difficulty with gait.  Neurological: Alert and oriented. Cranial nerves II-XII grossly intact. Coordination normal.  Psychiatric: Mood and affect flat.  Behavior is normal. Judgment and thought content normal.    BMET    Component Value Date/Time   NA 138 05/25/2019 1137   K 3.9 05/25/2019 1137   CL 102 05/25/2019 1137   CO2 30 05/25/2019 1137   GLUCOSE 84  05/25/2019 1137   BUN 14 05/25/2019 1137   CREATININE 0.80 05/25/2019 1137   CREATININE 0.96 04/19/2017 1510   CALCIUM 9.2 05/25/2019 1137    Lipid Panel     Component Value Date/Time   CHOL 222 (H) 05/25/2019 1137   TRIG 62.0 05/25/2019 1137   HDL 63.70 05/25/2019 1137   CHOLHDL 3 05/25/2019 1137   VLDL 12.4 05/25/2019 1137   LDLCALC 146 (H) 05/25/2019 1137    CBC    Component Value Date/Time   WBC 5.1 05/25/2019 1137   RBC 4.33 05/25/2019 1137   HGB 13.6 05/25/2019 1137   HCT 40.5 05/25/2019 1137   HCT 41 10/16/2017 0000   PLT 219.0 05/25/2019 1137   MCV 93.5 05/25/2019 1137   MCH  31.4 04/19/2017 1510   MCHC 33.5 05/25/2019 1137   RDW 12.7 05/25/2019 1137   LYMPHSABS 1,947 04/19/2017 1510   MONOABS 0.4 06/21/2015 0831   EOSABS 81 04/19/2017 1510   BASOSABS 50 04/19/2017 1510    Hgb A1C No results found for: HGBA1C          Assessment & Plan:   Preventative Health Maintenance:  She declines flu shot today but will get before the end of the flu season Tetanus UTD Encouraged her to get her COVID booster Discussed Shingrix vaccine, she will check coverage with her insurance currently Pap smear UTD Mammogram due-ordered, she will call to schedule Colon screening due but she prefers to get this next year Encouraged her to consume a balanced diet and exercise regimen Advised her to see an eye doctor and dentist annually We will check CBC, c-Met, TSH, lipid, A1c, HIV and hep C today  Fatigue:  She is requesting iron panel We will check TSH, vitamin D and B12 as well If labs normal, will consider sleep study to rule out  RTC in 1 year, sooner if needed Webb Silversmith, NP This visit occurred during the SARS-CoV-2 public health emergency.  Safety protocols were in place, including screening questions prior to the visit, additional usage of staff PPE, and extensive cleaning of exam room while observing appropriate contact time as indicated for disinfecting  solutions.

## 2021-01-12 NOTE — Assessment & Plan Note (Signed)
Continue high-fiber diet and adequate water intake Continue MiraLAX as needed

## 2021-01-13 LAB — LIPID PANEL
Cholesterol: 274 mg/dL — ABNORMAL HIGH (ref <200)
HDL: 63 mg/dL (ref >=50)
LDL Cholesterol (Calc): 194 mg/dL (calc) — ABNORMAL HIGH
Non-HDL Cholesterol (Calc): 211 mg/dL (calc) — ABNORMAL HIGH (ref <130)
Total CHOL/HDL Ratio: 4.3 (calc) (ref <5.0)
Triglycerides: 71 mg/dL (ref <150)

## 2021-01-13 LAB — COMPLETE METABOLIC PANEL WITH GFR
AG Ratio: 1.6 (calc) (ref 1.0–2.5)
ALT: 15 U/L (ref 6–29)
AST: 20 U/L (ref 10–35)
Albumin: 4.5 g/dL (ref 3.6–5.1)
Alkaline phosphatase (APISO): 82 U/L (ref 37–153)
BUN: 18 mg/dL (ref 7–25)
CO2: 30 mmol/L (ref 20–32)
Calcium: 9.7 mg/dL (ref 8.6–10.4)
Chloride: 103 mmol/L (ref 98–110)
Creat: 1 mg/dL (ref 0.50–1.03)
Globulin: 2.8 g/dL (calc) (ref 1.9–3.7)
Glucose, Bld: 99 mg/dL (ref 65–99)
Potassium: 4.7 mmol/L (ref 3.5–5.3)
Sodium: 139 mmol/L (ref 135–146)
Total Bilirubin: 0.6 mg/dL (ref 0.2–1.2)
Total Protein: 7.3 g/dL (ref 6.1–8.1)
eGFR: 67 mL/min/{1.73_m2} (ref >=60)

## 2021-01-13 LAB — CBC
HCT: 43.3 % (ref 35.0–45.0)
Hemoglobin: 14.3 g/dL (ref 11.7–15.5)
MCH: 31.1 pg (ref 27.0–33.0)
MCHC: 33 g/dL (ref 32.0–36.0)
MCV: 94.1 fL (ref 80.0–100.0)
MPV: 9.8 fL (ref 7.5–12.5)
Platelets: 257 10*3/uL (ref 140–400)
RBC: 4.6 10*6/uL (ref 3.80–5.10)
RDW: 11.9 % (ref 11.0–15.0)
WBC: 4.7 10*3/uL (ref 3.8–10.8)

## 2021-01-13 LAB — IRON,TIBC AND FERRITIN PANEL
%SAT: 23 % (calc) (ref 16–45)
Ferritin: 63 ng/mL (ref 16–232)
Iron: 70 ug/dL (ref 45–160)
TIBC: 311 mcg/dL (calc) (ref 250–450)

## 2021-01-13 LAB — HEMOGLOBIN A1C
Hgb A1c MFr Bld: 4.9 % of total Hgb (ref <5.7)
Mean Plasma Glucose: 94 mg/dL
eAG (mmol/L): 5.2 mmol/L

## 2021-01-13 LAB — VITAMIN B12: Vitamin B-12: 621 pg/mL (ref 200–1100)

## 2021-01-13 LAB — TSH: TSH: 2.09 mIU/L

## 2021-01-13 LAB — HEPATITIS C ANTIBODY
Hepatitis C Ab: NONREACTIVE
SIGNAL TO CUT-OFF: 0.01 (ref <1.00)

## 2021-01-13 LAB — VITAMIN D 25 HYDROXY (VIT D DEFICIENCY, FRACTURES): Vit D, 25-Hydroxy: 39 ng/mL (ref 30–100)

## 2021-01-13 LAB — HIV ANTIBODY (ROUTINE TESTING W REFLEX): HIV 1&2 Ab, 4th Generation: NONREACTIVE

## 2021-03-02 ENCOUNTER — Other Ambulatory Visit: Payer: Self-pay

## 2021-03-02 ENCOUNTER — Encounter: Payer: Self-pay | Admitting: Internal Medicine

## 2021-03-02 ENCOUNTER — Ambulatory Visit: Payer: BC Managed Care – PPO | Admitting: Internal Medicine

## 2021-03-02 VITALS — BP 114/61 | HR 59 | Temp 97.1°F | Resp 17 | Ht 66.0 in | Wt 155.4 lb

## 2021-03-02 DIAGNOSIS — J00 Acute nasopharyngitis [common cold]: Secondary | ICD-10-CM

## 2021-03-02 MED ORDER — PREDNISONE 10 MG PO TABS: ORAL_TABLET | ORAL | 0 refills | Status: DC

## 2021-03-02 MED ORDER — AZITHROMYCIN 250 MG PO TABS: ORAL_TABLET | ORAL | 0 refills | Status: DC

## 2021-03-02 NOTE — Progress Notes (Signed)
HPI  Pt presents to the clinic today with c/o headache, nasal congestion, sore throat, postnasal drip and cough.  She reports this started 3 weeks ago.  The headache is located in her forehead.  She describes the pain as pressure.  She denies dizziness or visual changes.  She is blowing clear mucus out of her nose.  She denies difficulty swallowing.  The cough is productive of green/brown mucus.  She denies ear pain, shortness of breath, fever, nausea, vomiting or diarrhea but has had chills and body aches.  She has taken Mucinex and Ibuprofen OTC with minimal relief of symptoms. She has not tested for covid or the flu. She has not had sick contacts that she is aware of.  Review of Systems      Past Medical History:  Diagnosis Date   Allergy    Anxiety    Arthritis    hands and feet and hips    Depression    Hyperlipidemia    IBS (irritable bowel syndrome)     Family History  Problem Relation Age of Onset   Heart disease Father    Colon polyps Mother    Cancer Paternal Grandfather        Prostate   Prostate cancer Paternal Grandfather    Colon cancer Maternal Aunt    Breast cancer Paternal Aunt 82   Breast cancer Paternal Grandmother 36   Rectal cancer Neg Hx    Stomach cancer Neg Hx     Social History   Socioeconomic History   Marital status: Married    Spouse name: Not on file   Number of children: Not on file   Years of education: Not on file   Highest education level: Not on file  Occupational History   Not on file  Tobacco Use   Smoking status: Former   Smokeless tobacco: Never  Vaping Use   Vaping Use: Never used  Substance and Sexual Activity   Alcohol use: Yes    Alcohol/week: 0.0 standard drinks    Comment: Social   Drug use: No   Sexual activity: Yes    Birth control/protection: Surgical    Comment: BTL-1st intercourse 55 yo-More than 5 partners  Other Topics Concern   Not on file  Social History Narrative   Not on file   Social Determinants of  Health   Financial Resource Strain: Not on file  Food Insecurity: Not on file  Transportation Needs: Not on file  Physical Activity: Not on file  Stress: Not on file  Social Connections: Not on file  Intimate Partner Violence: Not on file    No Known Allergies   Constitutional: Positive headache, fatigue and chills. Denies fever or abrupt weight changes.  HEENT:  Positive nasal congestion and sore throat. Denies eye redness, eye pain, pressure behind the eyes, facial pain, ear pain, ringing in the ears, wax buildup, runny nose or bloody nose. Respiratory: Positive cough. Denies difficulty breathing or shortness of breath.  Cardiovascular: Denies chest pain, chest tightness, palpitations or swelling in the hands or feet.   No other specific complaints in a complete review of systems (except as listed in HPI above).  Objective:   BP 114/61 (BP Location: Right Arm, Patient Position: Sitting, Cuff Size: Normal)   Pulse (!) 59   Temp (!) 97.1 F (36.2 C) (Temporal)   Resp 17   Ht 5\' 6"  (1.676 m)   Wt 155 lb 6.4 oz (70.5 kg)   SpO2 100%   BMI  25.08 kg/m   Wt Readings from Last 3 Encounters:  01/12/21 153 lb 3.2 oz (69.5 kg)  09/29/19 147 lb (66.7 kg)  05/25/19 143 lb (64.9 kg)     General: Appears her stated age, in NAD. HEENT: Head: normal shape and size;  Nose: congestion noted; Throat/Mouth: no hoarseness noted.  Neck: No cervical lymphadenopathy.  Cardiovascular: Bradycardic with normal rhythm. S1,S2 noted.  No murmur, rubs or gallops noted.  Pulmonary/Chest: Normal effort and positive vesicular breath sounds. No respiratory distress. No wheezes, rales or ronchi noted.       Assessment & Plan:   Upper Respiratory Infection:  Get some rest and drink plenty of water Do salt water gargles for the sore throat Rx for Azithromax x 5 days RX for Pred Taper x 6 days   RTC as needed or if symptoms persist.   Nicki Reaper, NP This visit occurred during the SARS-CoV-2  public health emergency.  Safety protocols were in place, including screening questions prior to the visit, additional usage of staff PPE, and extensive cleaning of exam room while observing appropriate contact time as indicated for disinfecting solutions.

## 2021-03-02 NOTE — Patient Instructions (Signed)
Upper Respiratory Infection, Adult  An upper respiratory infection (URI) affects the nose, throat, and upper air passages. URIs are caused by germs (viruses). The most common type of URI is often called "the common cold."  Medicines cannot cure URIs, but you can do things at home to relieve your symptoms. URIs usually get better within 7-10 days.  Follow these instructions at home:  Activity  Rest as needed.  If you have a fever, stay home from work or school until your fever is gone, or until your doctor says you may return to work or school.  You should stay home until you cannot spread the infection anymore (you are not contagious).  Your doctor may have you wear a face mask so you have less risk of spreading the infection.  Relieving symptoms  Gargle with a salt-water mixture 3-4 times a day or as needed. To make a salt-water mixture, completely dissolve -1 tsp of salt in 1 cup of warm water.  Use a cool-mist humidifier to add moisture to the air. This can help you breathe more easily.  Eating and drinking    Drink enough fluid to keep your pee (urine) pale yellow.  Eat soups and other clear broths.  General instructions    Take over-the-counter and prescription medicines only as told by your doctor. These include cold medicines, fever reducers, and cough suppressants.  Do not use any products that contain nicotine or tobacco. These include cigarettes and e-cigarettes. If you need help quitting, ask your doctor.  Avoid being where people are smoking (avoid secondhand smoke).  Make sure you get regular shots and get the flu shot every year.  Keep all follow-up visits as told by your doctor. This is important.  How to avoid spreading infection to others    Wash your hands often with soap and water. If you do not have soap and water, use hand sanitizer.  Avoid touching your mouth, face, eyes, or nose.  Cough or sneeze into a tissue or your sleeve or elbow. Do not cough or sneeze into your hand or into the  air.  Contact a doctor if:  You are getting worse, not better.  You have any of these:  A fever.  Chills.  Brown or red mucus in your nose.  Yellow or brown fluid (discharge)coming from your nose.  Pain in your face, especially when you bend forward.  Swollen neck glands.  Pain with swallowing.  White areas in the back of your throat.  Get help right away if:  You have shortness of breath that gets worse.  You have very bad or constant:  Headache.  Ear pain.  Pain in your forehead, behind your eyes, and over your cheekbones (sinus pain).  Chest pain.  You have long-lasting (chronic) lung disease along with any of these:  Wheezing.  Long-lasting cough.  Coughing up blood.  A change in your usual mucus.  You have a stiff neck.  You have changes in your:  Vision.  Hearing.  Thinking.  Mood.  Summary  An upper respiratory infection (URI) is caused by a germ called a virus. The most common type of URI is often called "the common cold."  URIs usually get better within 7-10 days.  Take over-the-counter and prescription medicines only as told by your doctor.  This information is not intended to replace advice given to you by your health care provider. Make sure you discuss any questions you have with your health care provider.  Document   Revised: 12/24/2019 Document Reviewed: 12/24/2019  Elsevier Patient Education  2022 [employer] Inc[employer]

## 2021-11-30 ENCOUNTER — Other Ambulatory Visit: Payer: Self-pay | Admitting: Internal Medicine

## 2021-12-01 NOTE — Telephone Encounter (Signed)
Patient will need to schedule appointment for further refills. Per notes form OV of 01/12/2022, pt is to rtc in 1 year. Requested Prescriptions  Pending Prescriptions Disp Refills  . escitalopram (LEXAPRO) 20 MG tablet [Pharmacy Med Name: ESCITALOPRAM 20 MG TABLET] 90 tablet 0    Sig: Take 1 tablet (20 mg total) by mouth at bedtime.     Psychiatry:  Antidepressants - SSRI Failed - 11/30/2021  6:53 PM      Failed - Valid encounter within last 6 months    Recent Outpatient Visits          9 months ago Acute nasopharyngitis   [facility], Salvadore Oxford, NP   10 months ago Encounter for general adult medical examination with abnormal findings   [facility] [place], Salvadore Oxford, NP             Passed - Completed PHQ-2 or PHQ-9 in the last 360 days

## 2022-01-23 ENCOUNTER — Encounter: Payer: Self-pay | Admitting: Internal Medicine

## 2022-01-24 ENCOUNTER — Ambulatory Visit: Payer: Self-pay

## 2022-01-24 NOTE — Telephone Encounter (Signed)
Chief Complaint: Chest pain, not currently Symptoms: Nausea every morning this week, hard to breathe through the pain when it happened, pain 9/10 when it happened, lower back pain off and on Frequency: Ongoing about 5 months off and on, most recent Sunday sitting in church Pertinent Negatives: Patient denies pain at present Disposition: [] ED /[] Urgent Care (no appt availability in office) / [x] Appointment(In office/virtual)/ []  [facility]/ [] Home Care/ [] Refused Recommended Disposition /[] [facility]/ []  Follow-up with PCP Additional Notes: Denies chest pain since Sunday, appt scheduled with Erin Mecum, PA-C for Friday, advised ED if chest pain returns. Reports taking tums to assist with the symptoms of nausea. Also reports her thoughts are not as sharp, words don't come as quickly as they should, fatigue.    Reason for Disposition  [1] Chest pain(s) lasting a few seconds AND [2] persists > 3 days  Answer Assessment - Initial Assessment Questions 1. LOCATION: "Where does it hurt?"       In between breast, deep heavy type pain 2. RADIATION: "Does the pain go anywhere else?" (e.g., into neck, jaw, arms, back)     Back once last month when it happened 3. ONSET: "When did the chest pain begin?" (Minutes, hours or days)      Happened on Sunday 4. PATTERN: "Does the pain come and go, or has it been constant since it started?"  "Does it get worse with exertion?"      Come and go 5. DURATION: "How long does it last" (e.g., seconds, minutes, hours)     Around 10-15 mins at the most 6. SEVERITY: "How bad is the pain?"  (e.g., Scale 1-10; mild, moderate, or severe)    - MILD (1-3): doesn't interfere with normal activities     - MODERATE (4-7): interferes with normal activities or awakens from sleep    - SEVERE (8-10): excruciating pain, unable to do any normal activities       9 7. CARDIAC RISK FACTORS: "Do you have any history of heart problems or risk factors for heart  disease?" (e.g., angina, prior heart attack; diabetes, high blood pressure, high cholesterol, smoker, or strong family history of heart disease)     Family history on both sides heart disease, high cholesterol, RBBB 8. PULMONARY RISK FACTORS: "Do you have any history of lung disease?"  (e.g., blood clots in lung, asthma, emphysema, birth control pills)     N/A 9. CAUSE: "What do you think is causing the chest pain?"     Unknown 10. OTHER SYMPTOMS: "Do you have any other symptoms?" (e.g., dizziness, nausea, vomiting, sweating, fever, difficulty breathing, cough)       Nausea every morning this week, SOB when having pain (hard to breathe through it) 11. PREGNANCY: "Is there any chance you are pregnant?" "When was your last menstrual period?"       N/A  Protocols used: Chest Pain-A-AH

## 2022-01-25 ENCOUNTER — Ambulatory Visit: Payer: BC Managed Care – PPO | Admitting: Nurse Practitioner

## 2022-01-25 DIAGNOSIS — Z0289 Encounter for other administrative examinations: Secondary | ICD-10-CM

## 2022-01-25 NOTE — Progress Notes (Deleted)
   Amanda Parrish 1965/12/28 [id number]   History:  56 y.o. [id number] presents for annual exam. Postmenopausal - no HRT, no bleeding. Normal pap history. Anxiety, depression, HLD managed by PCP.   Gynecologic History No LMP recorded. Patient has had an ablation.   Contraception/Family planning: post menopausal status Sexually active: ***  Health Maintenance Last Pap: 12/24/2018. Results were: Normal neg HPV Last mammogram: 06/26/2020. Results were: Normal Last colonoscopy: 09/08/2015. Results were: Normal, 5-year recall d/t h/o adenomas and family hx Last Dexa: Not indicated  Past medical history, past surgical history, family history and social history were all reviewed and documented in the EPIC chart. PGM and paternal aunt with history of breast cancer at age 27.   ROS:  A ROS was performed and pertinent positives and negatives are included.  Exam:  There were no vitals filed for this visit. There is no height or weight on file to calculate BMI.  General appearance:  Normal Thyroid:  Symmetrical, normal in size, without palpable masses or nodularity. Respiratory  Auscultation:  Clear without wheezing or rhonchi Cardiovascular  Auscultation:  Regular rate, without rubs, murmurs or gallops  Edema/varicosities:  Not grossly evident Abdominal  Soft,nontender, without masses, guarding or rebound.  Liver/spleen:  No organomegaly noted  Hernia:  None appreciated  Skin  Inspection:  Grossly normal Breasts: Examined lying and sitting.   Right: Without masses, retractions, nipple discharge or axillary adenopathy.   Left: Without masses, retractions, nipple discharge or axillary adenopathy. Genitourinary   Inguinal/mons:  Normal without inguinal adenopathy  External genitalia:  Normal appearing vulva with no masses, tenderness, or lesions  BUS/Urethra/Skene's glands:  Normal  Vagina:  Normal appearing with normal color and discharge, no lesions  Cervix:  Normal appearing  without discharge or lesions  Uterus:  Normal in size, shape and contour.  Midline and mobile, nontender  Adnexa/parametria:     Rt: Normal in size, without masses or tenderness.   Lt: Normal in size, without masses or tenderness.  Anus and perineum: Normal  Digital rectal exam: Normal sphincter tone without palpated masses or tenderness  Patient informed chaperone available to be present for breast and pelvic exam. Patient has requested no chaperone to be present. Patient has been advised what will be completed during breast and pelvic exam.   Assessment/Plan:  56 y.o. [id number] for annual exam.    Return in 1 year for annual.   Amanda Gammon DNP, 8:18 AM 01/25/2022

## 2022-01-26 ENCOUNTER — Ambulatory Visit: Payer: BC Managed Care – PPO | Admitting: Physician Assistant

## 2022-01-26 ENCOUNTER — Encounter: Payer: Self-pay | Admitting: Physician Assistant

## 2022-01-26 ENCOUNTER — Telehealth: Payer: Self-pay

## 2022-01-26 VITALS — BP 126/72 | HR 64 | Ht 66.0 in | Wt 168.0 lb

## 2022-01-26 DIAGNOSIS — R079 Chest pain, unspecified: Secondary | ICD-10-CM

## 2022-01-26 DIAGNOSIS — R1011 Right upper quadrant pain: Secondary | ICD-10-CM | POA: Insufficient documentation

## 2022-01-26 DIAGNOSIS — R072 Precordial pain: Secondary | ICD-10-CM | POA: Insufficient documentation

## 2022-01-26 DIAGNOSIS — R300 Dysuria: Secondary | ICD-10-CM

## 2022-01-26 LAB — POCT URINALYSIS DIPSTICK
Bilirubin, UA: NEGATIVE
Blood, UA: NEGATIVE
Glucose, UA: NEGATIVE
Ketones, UA: NEGATIVE
Nitrite, UA: NEGATIVE
Protein, UA: NEGATIVE
Spec Grav, UA: 1.01 (ref 1.010–1.025)
Urobilinogen, UA: 0.2 E.U./dL
pH, UA: 7 (ref 5.0–8.0)

## 2022-01-26 NOTE — Assessment & Plan Note (Signed)
Reports intermittent episodes of substernal chest pain and back pain, nausea that do not seem correlated with activity or exertion Will place RUQ [place] to rule out gallbladder etiology  Results to dictate further management

## 2022-01-26 NOTE — Telephone Encounter (Signed)
Yvetta Coder from [facility] calling to report that dx code for US abdomen has to be changed since it was for chest pain but imaging is looking at GB and liver. Called and spoke with Raquel Sarna, CMA who was able to speak with her and get codes corrected.

## 2022-01-26 NOTE — Progress Notes (Signed)
Acute Office Visit   Patient: Amanda Parrish   DOB: 05/10/1965   56 y.o. Female  MRN: [id number] Visit Date: 01/26/2022  Today's healthcare provider: Dani Gobble Anamari Galeas, PA-C  Introduced myself to the patient as a Journalist, newspaper and provided education on APPs in clinical practice.    Chief Complaint  Patient presents with   chest discomfort   Subjective    HPI   Reports chest pain States this started happening about 5 months ago and seem to be occurring more frequently.   Describes it as squeezing, tight pain in the center of the chest States she has some numbness and tingling in jaw and neck when it happens States pain is over sternum, tight and constricting Occasionally feels it going through to the back Reports pushing on sternum semi-recreates the sensation of pain  Reports it is sudden in onset, has happened at night and woken her up from sleep, this Sunday happened at church while she was sitting in pew- had only eaten yogurt that AM Usually resolves in 10-15 minutes  Does not seem to happen with exertion - states she does have to walk up 3 flights of stairs every day, several times per day- notices some heaviness and SOB when she does this but not the intense tightness or squeezing The times it has happened at night - she had not eaten for several hours prior The episode on Sunday - she had eaten about 90 minutes prior    Alleviating: Unsure - resting and remaining calm.  Aggravating: unsure of triggers or exacerbations Interventions: Has been using TUMS, Dramamine, Aspirin  Associated symptoms: reports anxiety and concern when it has happened at night, has had nausea as well.   Reports she has a hx of hiatal hernia and has had to have esophagus stretched in the past .   She reports she is concerned for a UTI as she has had intermittent burning with urination and she thinks she is having mild cognitive issues at work where people need to repeat things States she  would like to have her urine checked today.   Medications: Outpatient Medications Prior to Visit  Medication Sig   atorvastatin (LIPITOR) 40 MG tablet Take 1 tablet (40 mg total) by mouth daily at 6 PM.   buPROPion (WELLBUTRIN XL) 150 MG 24 hr tablet Take 2 tablets (300 mg total) by mouth daily.   clonazePAM (KLONOPIN) 1 MG tablet Take 1 tablet (1 mg total) by mouth daily as needed for anxiety.   escitalopram (LEXAPRO) 20 MG tablet Take 1 tablet (20 mg total) by mouth at bedtime.   Magnesium 250 MG TABS Take 250 mg by mouth daily.   VITAMIN D PO Take 10,000 Units by mouth every other day.    [DISCONTINUED] azithromycin (ZITHROMAX) 250 MG tablet Take 2 tabs today, then 1 tab daily x 4 days   [DISCONTINUED] predniSONE (DELTASONE) 10 MG tablet Take 6 tabs on day 1, 5 tabs on day 2, 4 tabs on day 3, 3 tabs on day 4, 2 tabs on day 5, 1 tab on day 6   No facility-administered medications prior to visit.    Review of Systems  Constitutional:  Negative for chills and diaphoresis.  Respiratory:  Positive for chest tightness.   Cardiovascular:  Positive for chest pain. Negative for palpitations.  Gastrointestinal:  Positive for diarrhea and nausea (was nauseous all day Sunday). Negative for constipation and vomiting.  Genitourinary:  Positive  for dysuria.  Neurological:  Positive for numbness. Negative for dizziness, facial asymmetry and headaches.       Objective    BP 126/72   Pulse 64   Ht 5\' 6"  (1.676 m)   Wt 168 lb (76.2 kg)   SpO2 98%   BMI 27.12 kg/m    Physical Exam Vitals reviewed.  Constitutional:      General: She is awake.     Appearance: Normal appearance. She is well-developed, well-groomed and normal weight.  HENT:     Head: Normocephalic and atraumatic.  Eyes:     General: Lids are normal. Gaze aligned appropriately.     Extraocular Movements: Extraocular movements intact.     Conjunctiva/sclera: Conjunctivae normal.     Pupils: Pupils are equal, round, and  reactive to light.  Cardiovascular:     Rate and Rhythm: Regular rhythm. Bradycardia present.     Pulses: Normal pulses.          Radial pulses are 2+ on the right side and 2+ on the left side.     Heart sounds: Normal heart sounds. No murmur heard.    No friction rub. No gallop.  Pulmonary:     Effort: Pulmonary effort is normal.     Breath sounds: Normal breath sounds. No decreased air movement. No decreased breath sounds, wheezing, rhonchi or rales.  Abdominal:     General: Abdomen is flat. Bowel sounds are normal.     Palpations: Abdomen is soft.     Tenderness: There is no abdominal tenderness. There is no guarding. Negative signs include Murphy's sign and McBurney's sign.  Musculoskeletal:     Cervical back: Normal range of motion and neck supple.     Right lower leg: No edema.     Left lower leg: No edema.  Neurological:     Mental Status: She is alert.  Psychiatric:        Attention and Perception: Attention normal.        Mood and Affect: Mood is anxious. Affect is tearful.        Speech: Speech normal.        Behavior: Behavior normal. Behavior is cooperative.          EKG performed today in office EKG demonstrates sinus bradycardia with rate of ~55 bpm Today's EKG was compared to previous EKG from 01/20/2015 and there appears to be longstanding potential RBBB There are new ST changes in leads V3 and V4 but I am unsure if this is due to EKG quality   Results for orders placed or performed in visit on 01/26/22  POCT Urinalysis Dipstick  Result Value Ref Range   Color, UA Yellow    Clarity, UA Clear    Glucose, UA Negative Negative   Bilirubin, UA Negative    Ketones, UA Negative    Spec Grav, UA 1.010 1.010 - 1.025   Blood, UA Negative    pH, UA 7.0 5.0 - 8.0   Protein, UA Negative Negative   Urobilinogen, UA 0.2 0.2 or 1.0 E.U./dL   Nitrite, UA Negative    Leukocytes, UA Small (1+) (A) Negative   Appearance     Odor      Assessment & Plan       Return in about 3 weeks (around 02/16/2022) for chest pain follow up .       Problem List Items Addressed This Visit       Other   Substernal chest pain  Recurrent concern  Patient reports recurrent episodes of  substernal chest pain lasting 10-15 minutes with squeezing and pressure sensation associated with jaw tingling and nausea. Reports these episodes have been occurring for several months and appear to be occurring more frequently.  Episodes do not appear to be related to exertion or activity levels as she has had at least 2 that have woken her from sleep.  She reports hx of high cholesterol- managed with Atorvastatin- and family hx of heart disease EKG ordered today- interpretation in PE Will place urgent referral to Cardiology for eval and collaboration for management Discussed potential causes of this - Cardiac etiology is priority for rule out, GERD, gallbladder, hiatal hernia are also potentials Recommend she start daily 81 mg aspirin while waiting for cardiology  Reviewed ED precautions- if this occurs again she should take 325 mg Aspirin and call EMS for assistance - she voiced understanding Will place order for [place] of liver, gallbladder today to rule out gallbladder etiology in coming weeks.  Recommend follow up in 1-2 weeks to discuss Cardiology eval and next steps.        Relevant Orders   Lipase   EKG 12-Lead   Right upper quadrant abdominal pain    Reports intermittent episodes of substernal chest pain and back pain, nausea that do not seem correlated with activity or exertion Will place RUQ [place] to rule out gallbladder etiology  Results to dictate further management       Relevant Orders   US ABDOMEN LIMITED RUQ (LIVER/GB)   Other Visit Diagnoses     Chest pain, unspecified type    -  Primary See substernal chest pain A&P for management plan.     Relevant Orders   Lipid Profile   CBC w/Diff/Platelet   COMPLETE METABOLIC PANEL WITH GFR   Lipase    Ambulatory referral to Cardiology   EKG 12-Lead   Dysuria     Acute, reports mild dysuria and memory/cognition concerns UA was ordered today and was +leukocytes Will send for culture to rule out UTI Results to dictate further management Follow up as needed for persistent or progressing symptoms.    Relevant Orders   POCT Urinalysis Dipstick (Completed)   Urine Culture      I spent approximately 40 minutes with patient discussing potential causes for her chest pain, interpreting results, and providing management plan.   Return in about 3 weeks (around 02/16/2022) for chest pain follow up .   I, Braelin Brosch E Biran Mayberry, PA-C, have reviewed all documentation for this visit. The documentation on 01/26/22 for the exam, diagnosis, procedures, and orders are all accurate and complete.   Talitha Givens, MHS, PA-C [facility]

## 2022-01-26 NOTE — Assessment & Plan Note (Addendum)
Recurrent concern  Patient reports recurrent episodes of  substernal chest pain lasting 10-15 minutes with squeezing and pressure sensation associated with jaw tingling and nausea. Reports these episodes have been occurring for several months and appear to be occurring more frequently.  Episodes do not appear to be related to exertion or activity levels as she has had at least 2 that have woken her from sleep.  She reports hx of high cholesterol- managed with Atorvastatin- and family hx of heart disease EKG ordered today- interpretation in PE Will place urgent referral to Cardiology for eval and collaboration for management Discussed potential causes of this - Cardiac etiology is priority for rule out, GERD, gallbladder, hiatal hernia are also potentials Recommend she start daily 81 mg aspirin while waiting for cardiology  Reviewed ED precautions- if this occurs again she should take 325 mg Aspirin and call EMS for assistance - she voiced understanding Will place order for [place] of liver, gallbladder today to rule out gallbladder etiology in coming weeks.  Recommend follow up in 1-2 weeks to discuss Cardiology eval and next steps.

## 2022-01-26 NOTE — Patient Instructions (Addendum)
Please start taking an 81 mg aspirin daily while you are waiting on Cardiology to call you for an apt  If you start having this chest pain again, please call EMS and take 4 of the 81 mg aspirin tablets while waiting for them to get to you and remain calm and at rest.

## 2022-01-27 LAB — CBC WITH DIFFERENTIAL/PLATELET
Absolute Monocytes: 399 cells/uL (ref 200–950)
Basophils Absolute: 49 cells/uL (ref 0–200)
Basophils Relative: 0.7 %
Eosinophils Absolute: 140 cells/uL (ref 15–500)
Eosinophils Relative: 2 %
HCT: 43.8 % (ref 35.0–45.0)
Hemoglobin: 14.5 g/dL (ref 11.7–15.5)
Lymphs Abs: 1666 cells/uL (ref 850–3900)
MCH: 31.5 pg (ref 27.0–33.0)
MCHC: 33.1 g/dL (ref 32.0–36.0)
MCV: 95.2 fL (ref 80.0–100.0)
MPV: 10.2 fL (ref 7.5–12.5)
Monocytes Relative: 5.7 %
Neutro Abs: 4746 cells/uL (ref 1500–7800)
Neutrophils Relative %: 67.8 %
Platelets: 260 10*3/uL (ref 140–400)
RBC: 4.6 10*6/uL (ref 3.80–5.10)
RDW: 12 % (ref 11.0–15.0)
Total Lymphocyte: 23.8 %
WBC: 7 10*3/uL (ref 3.8–10.8)

## 2022-01-27 LAB — COMPLETE METABOLIC PANEL WITH GFR
AG Ratio: 1.6 (calc) (ref 1.0–2.5)
ALT: 13 U/L (ref 6–29)
AST: 18 U/L (ref 10–35)
Albumin: 4.6 g/dL (ref 3.6–5.1)
Alkaline phosphatase (APISO): 97 U/L (ref 37–153)
BUN: 20 mg/dL (ref 7–25)
CO2: 28 mmol/L (ref 20–32)
Calcium: 9.7 mg/dL (ref 8.6–10.4)
Chloride: 103 mmol/L (ref 98–110)
Creat: 0.99 mg/dL (ref 0.50–1.03)
Globulin: 2.9 g/dL (calc) (ref 1.9–3.7)
Glucose, Bld: 82 mg/dL (ref 65–99)
Potassium: 4 mmol/L (ref 3.5–5.3)
Sodium: 141 mmol/L (ref 135–146)
Total Bilirubin: 0.6 mg/dL (ref 0.2–1.2)
Total Protein: 7.5 g/dL (ref 6.1–8.1)
eGFR: 67 mL/min/{1.73_m2} (ref >=60)

## 2022-01-27 LAB — LIPASE: Lipase: 58 U/L (ref 7–60)

## 2022-01-27 LAB — URINE CULTURE
MICRO NUMBER:: 13987285
SPECIMEN QUALITY:: ADEQUATE

## 2022-01-27 LAB — LIPID PANEL
Cholesterol: 276 mg/dL — ABNORMAL HIGH (ref <200)
HDL: 76 mg/dL (ref >=50)
LDL Cholesterol (Calc): 183 mg/dL (calc) — ABNORMAL HIGH
Non-HDL Cholesterol (Calc): 200 mg/dL (calc) — ABNORMAL HIGH (ref <130)
Total CHOL/HDL Ratio: 3.6 (calc) (ref <5.0)
Triglycerides: 65 mg/dL (ref <150)

## 2022-01-28 ENCOUNTER — Encounter: Payer: Self-pay | Admitting: Physician Assistant

## 2022-01-29 NOTE — Telephone Encounter (Signed)
Addressed these concerns in her result note from her UA labs.

## 2022-02-01 ENCOUNTER — Ambulatory Visit
Admission: RE | Admit: 2022-02-01 | Discharge: 2022-02-01 | Disposition: A | Discharge status: Home / Self Care | Payer: BC Managed Care – PPO | Source: Ambulatory Visit | Attending: Physician Assistant | Admitting: Physician Assistant

## 2022-02-01 DIAGNOSIS — R1011 Right upper quadrant pain: Secondary | ICD-10-CM

## 2022-02-02 ENCOUNTER — Other Ambulatory Visit: Payer: Self-pay | Admitting: Internal Medicine

## 2022-02-02 ENCOUNTER — Encounter: Payer: Self-pay | Admitting: Internal Medicine

## 2022-02-02 MED ORDER — BUPROPION HCL ER (XL) 150 MG PO TB24: 300.0000 mg | ORAL_TABLET | Freq: Every day | ORAL | 0 refills | Status: DC

## 2022-02-02 NOTE — Telephone Encounter (Signed)
Requested Prescriptions  Pending Prescriptions Disp Refills  . buPROPion (WELLBUTRIN XL) 150 MG 24 hr tablet [Pharmacy Med Name: BUPROPION HCL XL 150 MG TABLET] 180 tablet 0    Sig: Take 2 tablets (300 mg total) by mouth daily.     Psychiatry: Antidepressants - bupropion Passed - 02/02/2022 12:24 PM      Passed - Cr in normal range and within 360 days    Creat  Date Value Ref Range Status  01/26/2022 0.99 0.50 - 1.03 mg/dL Final         Passed - AST in normal range and within 360 days    AST  Date Value Ref Range Status  01/26/2022 18 10 - 35 U/L Final         Passed - ALT in normal range and within 360 days    ALT  Date Value Ref Range Status  01/26/2022 13 6 - 29 U/L Final         Passed - Completed PHQ-2 or PHQ-9 in the last 360 days      Passed - Last BP in normal range    BP Readings from Last 1 Encounters:  01/26/22 126/72         Passed - Valid encounter within last 6 months    Recent Outpatient Visits          1 week ago Chest pain, unspecified type   [place], [place]   11 months ago Acute nasopharyngitis   [facility], Coralie Keens, NP   1 year ago Encounter for general adult medical examination with abnormal findings   [facility], Coralie Keens, NP      Future Appointments            In 1 month Ag[place]Et[place], Aaron Edelman, MD [place]. [place]

## 2022-02-28 ENCOUNTER — Other Ambulatory Visit: Payer: Self-pay | Admitting: Nurse Practitioner

## 2022-02-28 DIAGNOSIS — Z8249 Family history of ischemic heart disease and other diseases of the circulatory system: Secondary | ICD-10-CM

## 2022-03-01 ENCOUNTER — Ambulatory Visit
Admission: RE | Admit: 2022-03-01 | Discharge: 2022-03-01 | Disposition: A | Discharge status: Home / Self Care | Payer: BC Managed Care – PPO | Source: Ambulatory Visit | Attending: Nurse Practitioner | Admitting: Nurse Practitioner

## 2022-03-01 DIAGNOSIS — Z8249 Family history of ischemic heart disease and other diseases of the circulatory system: Secondary | ICD-10-CM

## 2022-03-19 ENCOUNTER — Other Ambulatory Visit
Admission: RE | Admit: 2022-03-19 | Discharge: 2022-03-19 | Disposition: A | Discharge status: Home / Self Care | Payer: BC Managed Care – PPO | Source: Ambulatory Visit | Attending: Cardiology | Admitting: Cardiology

## 2022-03-19 ENCOUNTER — Ambulatory Visit: Payer: BC Managed Care – PPO | Attending: Cardiology | Admitting: Cardiology

## 2022-03-19 ENCOUNTER — Encounter: Payer: Self-pay | Admitting: Cardiology

## 2022-03-19 ENCOUNTER — Telehealth: Payer: Self-pay | Admitting: Cardiology

## 2022-03-19 VITALS — BP 118/68 | HR 69 | Ht 66.0 in | Wt 164.0 lb

## 2022-03-19 DIAGNOSIS — R072 Precordial pain: Secondary | ICD-10-CM

## 2022-03-19 DIAGNOSIS — E7800 Pure hypercholesterolemia, unspecified: Secondary | ICD-10-CM

## 2022-03-19 LAB — BASIC METABOLIC PANEL
Anion gap: 7 (ref 5–15)
BUN: 25 mg/dL — ABNORMAL HIGH (ref 6–20)
CO2: 28 mmol/L (ref 22–32)
Calcium: 9.6 mg/dL (ref 8.9–10.3)
Chloride: 103 mmol/L (ref 98–111)
Creatinine, Ser: 0.92 mg/dL (ref 0.44–1.00)
GFR, Estimated: >60 mL/min (ref >60)
Glucose, Bld: 93 mg/dL (ref 70–99)
Potassium: 3.9 mmol/L (ref 3.5–5.1)
Sodium: 138 mmol/L (ref 135–145)

## 2022-03-19 MED ORDER — ATORVASTATIN CALCIUM 80 MG PO TABS: ORAL_TABLET | ORAL | 5 refills | Status: DC

## 2022-03-19 MED ORDER — ATORVASTATIN CALCIUM 80 MG PO TABS: 80.0000 mg | ORAL_TABLET | Freq: Every day | ORAL | 3 refills | Status: DC

## 2022-03-19 MED ORDER — METOPROLOL TARTRATE 100 MG PO TABS: 100.0000 mg | ORAL_TABLET | Freq: Once | ORAL | 0 refills | Status: DC

## 2022-03-19 NOTE — Telephone Encounter (Signed)
Pt c/o medication issue:  1. Name of Medication: atorvastatin (LIPITOR) 80 MG tablet   2. How are you currently taking this medication (dosage and times per day)? N/A  3. Are you having a reaction (difficulty breathing--STAT)? N/A  4. What is your medication issue? Instructions of prescription advise to take 40 mg's instead of 80. Requesting new prescription with correct instructions. Please advise.

## 2022-03-19 NOTE — Telephone Encounter (Signed)
Called pharmacy back and clarified the Atorvastatin dose was to be 80 MG once a day.

## 2022-03-19 NOTE — Patient Instructions (Signed)
Medication Instructions:   Your physician has recommended you make the following change in your medication:    INCREASE your Atorvastatin to 80 MG once a day.   *If you need a refill on your cardiac medications before your next appointment, please call your pharmacy*   Lab Work:  Please go to the [facility] after your appointment today for a BMP lab draw.   Testing/Procedures:    Your physician has requested that you have an echocardiogram. Echocardiography is a painless test that uses sound waves to create images of your heart. It provides your doctor with information about the size and shape of your heart and how well your heart's chambers and valves are working. This procedure takes approximately one hour. There are no restrictions for this procedure. Please do NOT wear cologne, perfume, aftershave, or lotions (deodorant is allowed). Please arrive 15 minutes prior to your appointment time.   2.   Your physician has requested that you have cardiac CT. Cardiac computed tomography (CT) is a painless test that uses an x-ray machine to take clear, detailed pictures of your heart.    Your cardiac CT will be scheduled at:  [facility] [address] [place], [place] [address] [phone]  OR   [facility] [address] [place], [place] [address] [phone]  Please arrive 15 mins early for check-in and test prep.    Please follow these instructions carefully (unless otherwise directed):   Hold all erectile dysfunction medications at least 3 days (72 hrs) prior to test. (Ie viagra, cialis, sildenafil, tadalafil, etc) We will administer nitroglycerin during this exam.    Night Before the Test: Be sure to Drink plenty of water. Do not consume any caffeinated/decaffeinated beverages or chocolate 12 hours prior to your test.    On the Day of the Test: Drink plenty of water until 1 hour prior to  the test. Do not eat any food 4 hours prior to the test. You may take your regular medications prior to the test.  Take metoprolol (Lopressor) 100 MG two hours prior to test. FEMALES- please wear underwire-free bra if available, avoid dresses & tight clothing        After the Test: Drink plenty of water. After receiving IV contrast, you may experience a mild flushed feeling. This is normal. On occasion, you may experience a mild rash up to 24 hours after the test. This is not dangerous. If this occurs, you can take Benadryl 25 mg and increase your fluid intake. If you experience trouble breathing, this can be serious. If it is severe call 911 IMMEDIATELY. If it is mild, please call our office. If you take any of these medications: Glipizide/Metformin, Avandament, Glucavance, please do not take 48 hours after completing test unless otherwise instructed.  Please allow 2-4 weeks for scheduling of routine cardiac CTs. Some insurance companies require a pre-authorization which may delay scheduling of this test.   For non-scheduling related questions, please contact the cardiac imaging nurse navigator should you have any questions/concerns: Rockwell [place], Cardiac Imaging Nurse Navigator Larey Brick, Cardiac Imaging Nurse Navigator [facility] Direct Office Dial: [phone]   For scheduling needs, including cancellations and rescheduling, please call [place], [phone].     Follow-Up: At [facility], you and your health needs are our priority.  As part of our continuing mission to provide you with exceptional heart care, we have created designated Provider Care Teams.  These Care Teams include your primary Cardiologist (physician) and Advanced Practice Providers (APPs -  Physician Assistants and Nurse Practitioners) who all work together to provide you with the care you need, when you need it.  We recommend signing up for the patient portal called  "MyChart".  Sign up information is provided on this After Visit Summary.  MyChart is used to connect with patients for Virtual Visits (Telemedicine).  Patients are able to view lab/test results, encounter notes, upcoming appointments, etc.  Non-urgent messages can be sent to your provider as well.   To learn more about what you can do with MyChart, go to [email or web address].    Your next appointment:   Follow up after testing   The format for your next appointment:   In Person  Provider:   You may see Debbe Odea, MD or one of the following Advanced Practice Providers on your designated Care Team:   Nicolasa Ducking, NP Eula Listen, PA-C Cadence Fransico Michael, PA-C Charlsie Quest, NP    Other Instructions   Important Information About Sugar

## 2022-03-19 NOTE — Progress Notes (Signed)
Cardiology Office Note:    Date:  03/19/2022   ID:  Amanda Parrish, DOB 1965-05-26, MRN [id number]  PCP:  Jearld Fenton, NP   [facility] Providers Cardiologist:  None     Referring MD: Almon Register, PA-C   Chief Complaint  Patient presents with   New Patient (Initial Visit)    Chest pain, PCP referral, Family Hx Bilateral arm pain   Amanda Parrish is a 56 y.o. female who is being seen today for the evaluation of chest pain at the request of Mecum, Erin E, PA-C.   History of Present Illness:    Amanda Parrish is a 56 y.o. female with a hx of hyperlipidemia, former smoker x7 years, anxiety who presents with symptoms of chest pain.  States having symptoms of chest pain ongoing for about a year now, symptoms usually wake her up from sleep.  She sometimes has chest pain or shortness of breath with exertion while working to work.  She works at the court house.  Denies any personal history of heart disease, father had a heart attack in his 69s.  Has been on Lipitor for about a year and a half now.  Last cholesterol lab work not controlled.  Past Medical History:  Diagnosis Date   Allergy    Anxiety    Arthritis    hands and feet and hips    Depression    Hyperlipidemia    IBS (irritable bowel syndrome)     Past Surgical History:  Procedure Laterality Date   COLONOSCOPY     ENDOMETRIAL ABLATION  2007   Novsure   POLYPECTOMY     TA 2008   TOOTH EXTRACTION     TUBAL LIGATION     UPPER GASTROINTESTINAL ENDOSCOPY     stricture and had dilatation   WISDOM TOOTH EXTRACTION      Current Medications: Current Meds  Medication Sig   buPROPion (WELLBUTRIN XL) 150 MG 24 hr tablet Take 2 tablets (300 mg total) by mouth daily.   escitalopram (LEXAPRO) 20 MG tablet Take 1 tablet (20 mg total) by mouth at bedtime. (Patient taking differently: Take 10 mg by mouth at bedtime.)   Magnesium 250 MG TABS Take 250 mg by mouth daily.   metoprolol tartrate  (LOPRESSOR) 100 MG tablet Take 1 tablet (100 mg total) by mouth once for 1 dose. Take 2 hours prior to your CT scan.   VITAMIN D PO Take 10,000 Units by mouth every other day.    [DISCONTINUED] atorvastatin (LIPITOR) 40 MG tablet Take 1 tablet (40 mg total) by mouth daily at 6 PM.     Allergies:   Patient has no known allergies.   Social History   Socioeconomic History   Marital status: Married    Spouse name: Not on file   Number of children: Not on file   Years of education: Not on file   Highest education level: Not on file  Occupational History   Not on file  Tobacco Use   Smoking status: Former    Packs/day: 0.50    Types: Cigarettes   Smokeless tobacco: Never  Vaping Use   Vaping Use: Never used  Substance and Sexual Activity   Alcohol use: Yes    Alcohol/week: 0.0 standard drinks of alcohol    Comment: Social   Drug use: No   Sexual activity: Yes    Birth control/protection: Surgical    Comment: BTL-1st intercourse 56 yo-More than 5 partners  Other Topics Concern   Not on file  Social History Narrative   Not on file   Social Determinants of Health   Financial Resource Strain: Not on file  Food Insecurity: Not on file  Transportation Needs: Not on file  Physical Activity: Not on file  Stress: Not on file  Social Connections: Not on file     Family History: The patient's family history includes Breast cancer (age of onset: 107) in her paternal aunt; Breast cancer (age of onset: 31) in her paternal grandmother; Cancer in her paternal grandfather; Colon cancer in her maternal aunt; Colon polyps in her mother; Heart disease in her father; Prostate cancer in her paternal grandfather. There is no history of Rectal cancer or Stomach cancer.  ROS:   Please see the history of present illness.     All other systems reviewed and are negative.  EKGs/Labs/Other Studies Reviewed:    The following studies were reviewed today:   EKG:  EKG is  ordered today.  The ekg  ordered today demonstrates normal sinus rhythm, right bundle branch block  Recent Labs: 01/26/2022: ALT 13; Hemoglobin 14.5; Platelets 260 03/19/2022: BUN 25; Creatinine, Ser 0.92; Potassium 3.9; Sodium 138  Recent Lipid Panel    Component Value Date/Time   CHOL 276 (H) 01/26/2022 1044   TRIG 65 01/26/2022 1044   HDL 76 01/26/2022 1044   CHOLHDL 3.6 01/26/2022 1044   VLDL 12.4 05/25/2019 1137   LDLCALC 183 (H) 01/26/2022 1044     Risk Assessment/Calculations:             Physical Exam:    VS:  BP 118/68 (BP Location: Right Arm, Patient Position: Sitting, Cuff Size: Normal)   Pulse 69   Ht 5\' 6"  (1.676 m)   Wt 164 lb (74.4 kg)   SpO2 97%   BMI 26.47 kg/m     Wt Readings from Last 3 Encounters:  03/19/22 164 lb (74.4 kg)  01/26/22 168 lb (76.2 kg)  03/02/21 155 lb 6.4 oz (70.5 kg)     GEN:  Well nourished, well developed in no acute distress HEENT: Normal NECK: No JVD; No carotid bruits CARDIAC: RRR, no murmurs, rubs, gallops RESPIRATORY:  Clear to auscultation without rales, wheezing or rhonchi  ABDOMEN: Soft, non-tender, non-distended MUSCULOSKELETAL:  No edema; No deformity  SKIN: Warm and dry NEUROLOGIC:  Alert and oriented x 3 PSYCHIATRIC:  Normal affect   ASSESSMENT:    1. Precordial pain   2. Pure hypercholesterolemia    PLAN:    In order of problems listed above:  Chest pain, risk factors hyperlipidemia,?  Family history.  Get echocardiogram, get coronary CTA. Hyperlipidemia, increase Lipitor to 80 mg daily.  Follow-up after echo and coronary CTA     Medication Adjustments/Labs and Tests Ordered: Current medicines are reviewed at length with the patient today.  Concerns regarding medicines are outlined above.  Orders Placed This Encounter  Procedures   CT CORONARY MORPH W/CTA COR W/SCORE W/CA W/CM &/OR WO/CM   Basic metabolic panel   EKG 12-Lead   ECHOCARDIOGRAM COMPLETE   Meds ordered this encounter  Medications   metoprolol tartrate  (LOPRESSOR) 100 MG tablet    Sig: Take 1 tablet (100 mg total) by mouth once for 1 dose. Take 2 hours prior to your CT scan.    Dispense:  1 tablet    Refill:  0   atorvastatin (LIPITOR) 80 MG tablet    Sig: Take 1 tablet (40 mg total) by mouth  daily at 6 PM.    Dispense:  30 tablet    Refill:  5    Patient Instructions  Medication Instructions:   Your physician has recommended you make the following change in your medication:    INCREASE your Atorvastatin to 80 MG once a day.   *If you need a refill on your cardiac medications before your next appointment, please call your pharmacy*   Lab Work:  Please go to the [facility] after your appointment today for a BMP lab draw.   Testing/Procedures:    Your physician has requested that you have an echocardiogram. Echocardiography is a painless test that uses sound waves to create images of your heart. It provides your doctor with information about the size and shape of your heart and how well your heart's chambers and valves are working. This procedure takes approximately one hour. There are no restrictions for this procedure. Please do NOT wear cologne, perfume, aftershave, or lotions (deodorant is allowed). Please arrive 15 minutes prior to your appointment time.   2.   Your physician has requested that you have cardiac CT. Cardiac computed tomography (CT) is a painless test that uses an x-ray machine to take clear, detailed pictures of your heart.    Your cardiac CT will be scheduled at:  [facility] [address] [place], [place] [address] (336) [facility] [place],  [address] [phone]  Please arrive 15 mins early for check-in and test prep.    Please follow these instructions carefully (unless otherwise directed):   Hold all erectile dysfunction medications at least 3 days (72 hrs) prior to test. (Ie  viagra, cialis, sildenafil, tadalafil, etc) We will administer nitroglycerin during this exam.    Night Before the Test: Be sure to Drink plenty of water. Do not consume any caffeinated/decaffeinated beverages or chocolate 12 hours prior to your test.    On the Day of the Test: Drink plenty of water until 1 hour prior to the test. Do not eat any food 4 hours prior to the test. You may take your regular medications prior to the test.  Take metoprolol (Lopressor) 100 MG two hours prior to test. FEMALES- please wear underwire-free bra if available, avoid dresses & tight clothing        After the Test: Drink plenty of water. After receiving IV contrast, you may experience a mild flushed feeling. This is normal. On occasion, you may experience a mild rash up to 24 hours after the test. This is not dangerous. If this occurs, you can take Benadryl 25 mg and increase your fluid intake. If you experience trouble breathing, this can be serious. If it is severe call 911 IMMEDIATELY. If it is mild, please call our office. If you take any of these medications: Glipizide/Metformin, Avandament, Glucavance, please do not take 48 hours after completing test unless otherwise instructed.  Please allow 2-4 weeks for scheduling of routine cardiac CTs. Some insurance companies require a pre-authorization which may delay scheduling of this test.   For non-scheduling related questions, please contact the cardiac imaging nurse navigator should you have any questions/concerns: Marchia Bond, Cardiac Imaging Nurse Navigator Gordy Clement, Cardiac Imaging Nurse Navigator [facility] Direct Office Dial: [phone]   For scheduling needs, including cancellations and rescheduling, please call [place], [phone].     Follow-Up: At [facility], you and your health needs are our  priority.  As part of our continuing mission to provide you with exceptional heart care, we  have created designated Provider Care Teams.  These Care Teams include your primary Cardiologist (physician) and Advanced Practice Providers (APPs -  Physician Assistants and Nurse Practitioners) who all work together to provide you with the care you need, when you need it.  We recommend signing up for the patient portal called "MyChart".  Sign up information is provided on this After Visit Summary.  MyChart is used to connect with patients for Virtual Visits (Telemedicine).  Patients are able to view lab/test results, encounter notes, upcoming appointments, etc.  Non-urgent messages can be sent to your provider as well.   To learn more about what you can do with MyChart, go to [email or web address].    Your next appointment:   Follow up after testing   The format for your next appointment:   In Person  Provider:   You may see Debbe Odea, MD or one of the following Advanced Practice Providers on your designated Care Team:   Nicolasa Ducking, NP Eula Listen, PA-C Cadence Fransico Michael, PA-C Charlsie Quest, NP    Other Instructions   Important Information About Sugar         Signed, Debbe Odea, MD  03/19/2022 10:54 AM    [facility]

## 2022-04-06 ENCOUNTER — Telehealth (HOSPITAL_COMMUNITY): Payer: Self-pay | Admitting: *Deleted

## 2022-04-06 NOTE — Telephone Encounter (Signed)
Reaching out to patient to offer assistance regarding upcoming cardiac imaging study; pt verbalizes understanding of appt date/time, parking situation and where to check in, pre-test NPO status and medications ordered, and verified current allergies; name and call back number provided for further questions should they arise ? ?Carlon Davidson RN Navigator Cardiac Imaging ?[facility] ?[phone] office ?[phone] cell ? ?Patient to take 100mg metoprolol tartrate two hours prior to her cardiac CT scan.  ?

## 2022-04-09 ENCOUNTER — Ambulatory Visit
Admission: RE | Admit: 2022-04-09 | Discharge: 2022-04-09 | Disposition: A | Discharge status: Home / Self Care | Payer: BC Managed Care – PPO | Source: Ambulatory Visit | Attending: Cardiology | Admitting: Cardiology

## 2022-04-09 DIAGNOSIS — R072 Precordial pain: Secondary | ICD-10-CM | POA: Insufficient documentation

## 2022-04-09 MED ORDER — NITROGLYCERIN 0.4 MG SL SUBL
SUBLINGUAL_TABLET | SUBLINGUAL | Status: AC
  Administered 2022-04-09: 0.8 mg via SUBLINGUAL
  Filled 2022-04-09: qty 1

## 2022-04-09 MED ORDER — NITROGLYCERIN 0.4 MG SL SUBL: 0.8000 mg | SUBLINGUAL_TABLET | Freq: Once | SUBLINGUAL | Status: AC

## 2022-04-09 MED ORDER — IOHEXOL 350 MG/ML SOLN
100.0000 mL | Freq: Once | INTRAVENOUS | Status: AC | PRN
  Administered 2022-04-09: 100 mL via INTRAVENOUS

## 2022-04-09 MED ORDER — NITROGLYCERIN 0.4 MG SL SUBL
SUBLINGUAL_TABLET | SUBLINGUAL | Status: AC
  Filled 2022-04-09: qty 1

## 2022-04-09 NOTE — Progress Notes (Signed)
Patient tolerated procedure well. Ambulate w/o difficulty. Denies any lightheadedness or being dizzy. Pt denies any pain at this time. Sitting in chair. Pt is encouraged to drink additional water throughout the day and reason explained to patient. Patient verbalized understanding and all questions answered. ABC intact. No further needs at this time. Discharge from procedure area w/o issues. 

## 2022-04-10 ENCOUNTER — Ambulatory Visit: Payer: BC Managed Care – PPO | Admitting: Internal Medicine

## 2022-04-10 ENCOUNTER — Encounter: Payer: Self-pay | Admitting: Internal Medicine

## 2022-04-10 ENCOUNTER — Ambulatory Visit: Payer: BC Managed Care – PPO | Attending: Cardiology

## 2022-04-10 VITALS — BP 92/50 | HR 60 | Ht 66.0 in | Wt 161.0 lb

## 2022-04-10 DIAGNOSIS — Z86010 Personal history of colonic polyps: Secondary | ICD-10-CM | POA: Diagnosis not present

## 2022-04-10 DIAGNOSIS — R072 Precordial pain: Secondary | ICD-10-CM

## 2022-04-10 DIAGNOSIS — K581 Irritable bowel syndrome with constipation: Secondary | ICD-10-CM | POA: Diagnosis not present

## 2022-04-10 DIAGNOSIS — Z800 Family history of malignant neoplasm of digestive organs: Secondary | ICD-10-CM

## 2022-04-10 DIAGNOSIS — Z83719 Family history of colon polyps, unspecified: Secondary | ICD-10-CM

## 2022-04-10 DIAGNOSIS — R1319 Other dysphagia: Secondary | ICD-10-CM | POA: Diagnosis not present

## 2022-04-10 NOTE — Patient Instructions (Signed)
You have been scheduled for an endoscopy. Please follow written instructions given to you at your visit today. If you use inhalers (even only as needed), please bring them with you on the day of your procedure.  We are changing you colonoscopy recall to May 2024.   Due to recent changes in healthcare laws, you may see the results of your imaging and laboratory studies on MyChart before your provider has had a chance to review them.  We understand that in some cases there may be results that are confusing or concerning to you. Not all laboratory results come back in the same time frame and the provider may be waiting for multiple results in order to interpret others.  Please give [place] 48 hours in order for your provider to thoroughly review all the results before contacting the office for clarification of your results.    I appreciate the opportunity to care for you. Stan Head, MD

## 2022-04-10 NOTE — Progress Notes (Signed)
Amanda Parrish 56 y.o. 01-29-1966 [id number]  Assessment & Plan:   Encounter Diagnoses  Name Primary?   Esophageal dysphagia Yes   Irritable bowel syndrome with constipation    Hx of adenomatous colonic polyps    Family history of colon cancer - great aunt    Family history of colonic polyps     EGD with esophageal dilation.  I suspect she has another esophageal stricture.  Consider the development of motility issues perhaps.  I will review echocardiogram but so far her cardiac workup is fine and I do not think there is any problem here.  The risks and benefits as well as alternatives of endoscopic procedure(s) have been discussed and reviewed. All questions answered. The patient agrees to proceed.  Regarding her history of polyps in the family history, over time the guidelines have involved some.  We are deferring her colonoscopy for another year which will be 7 years from her last 1.  If that 1 is clear I suspect she can go every 10 years.  Regarding the casein and wheat allergy, it is my understanding that antibody testing for food allergies is problematic and not reliable.  It certainly fine to avoid these to see if it makes a difference.  She will continue MiraLAX for IBS-see CC: Baity, Salvadore Oxford, NP Maximino Sarin, NP  Subjective:   Chief Complaint: Dysphagia  HPI 56 year old white woman with a history of colon polyps, IBS-C, and dysphagia in the past who presents with recurrent dysphagia problems.  For a year or so she has had intermittent solid food dysphagia, describing a suprasternal or upper sternal sticking point.  She usually waits and the food passes.  Sometimes liquids and pills are a problem.  If she drinks on top of an impaction she will often regurgitate the food bolus and water.  She has modified her diet and is improved but still has problems.  She has stopped eating meat.  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  Diagnosed by blood testing.  For several months she has had some intermittent nonexertional chest pain often awaken her at night.  Substernal and intense that improves with time.  She is not having heartburn problems at this time.  She uses MiraLAX intermittently for her constipation problems with bloating associated with IBS and that is under control she says. Wt Readings from Last 3 Encounters:  04/10/22 161 lb (73 kg)  03/19/22 164 lb (74.4 kg)  01/26/22 168 lb (76.2 kg)     Cardiology evaluation for chest pain 03/19/2022-notes reviewed Coronary calcium score 1.116 75th percentile 03/01/2022 For morphologic CT coronary study showed a calcium score of 4 and no evidence of coronary artery disease In cardiogram was done today result pending   EGD 2006 distal esophageal stricture dilated, she was also helped by clonazepam  Colonoscopy 09/08/2015 normal Colonoscopy history: 07/2006 - 2 adenomas 2011 no polyps 09/08/2015 no polyps - recall 5 yrs FHx CRCA great aunt, polyps mom and maternal aunt No Known Allergies Current Meds  Medication Sig   atorvastatin (LIPITOR) 80 MG tablet Take 1 tablet (80 mg total) by mouth daily. Take 1 tablet (40 mg total) by mouth daily at 6 PM. (Patient taking differently: Take 80 mg by mouth daily.)   buPROPion (WELLBUTRIN XL) 150 MG 24 hr tablet Take 2 tablets (300 mg total) by mouth daily.   clonazePAM (KLONOPIN) 1 MG tablet Take  1 tablet (1 mg total) by mouth daily as needed for anxiety.   escitalopram (LEXAPRO) 20 MG tablet Take 1 tablet (20 mg total) by mouth at bedtime. (Patient taking differently: Take 10 mg by mouth at bedtime.)   Magnesium 250 MG TABS Take 250 mg by mouth daily.   VITAMIN D PO Take 10,000 Units by mouth every other day.    Past Medical History:  Diagnosis Date   Allergy    Anemia    Anxiety    Arthritis    hands and feet and hips    Depression    GERD (gastroesophageal reflux disease)     Hyperlipidemia    IBS (irritable bowel syndrome)    Status post dilation of esophageal narrowing    Past Surgical History:  Procedure Laterality Date   COLONOSCOPY     ENDOMETRIAL ABLATION  2007   Novsure   POLYPECTOMY     TA 2008   TOOTH EXTRACTION     TUBAL LIGATION     UPPER GASTROINTESTINAL ENDOSCOPY     stricture and had dilatation   WISDOM TOOTH EXTRACTION     Social History   Social History Narrative   Patient is married   She has 1 son and 1 daughter   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   Review of Systems As per HPI.  She is currently weaning Lexapro and using a lot less in the way of clonazepam as needed panic attacks.  Objective:   Physical Exam  @BP  (!) 92/50   Pulse 60   Ht 5\' 6"  (1.676 m)   Wt 161 lb (73 kg)   SpO2 97%   BMI 25.99 kg/m @  General:  Well-developed, well-nourished and in no acute distress Eyes:  anicteric. ENT:   Mouth and posterior pharynx free of lesions.  Dentition intact  Lungs: Clear to auscultation bilaterally. Heart:   S1S2, no rubs, murmurs, gallops. Abdomen:  soft, non-tender, no hepatosplenomegaly, hernia, or mass and BS+.  Rectal: Deferred Extremities:   no edema, cyanosis or clubbing Skin   no rash. Neuro:  A&O x 3.  Psych:  appropriate mood and  Affect.   Data Reviewed: See HPI

## 2022-04-11 LAB — ECHOCARDIOGRAM COMPLETE
AR max vel: 2.65 cm2
AV Area VTI: 2.87 cm2
AV Area mean vel: 2.69 cm2
AV Mean grad: 3 mmHg
AV Peak grad: 5.4 mmHg
Ao pk vel: 1.16 m/s
Area-P 1/2: 2.93 cm2
S' Lateral: 2.7 cm

## 2022-04-12 ENCOUNTER — Other Ambulatory Visit: Payer: BC Managed Care – PPO

## 2022-04-17 ENCOUNTER — Encounter: Payer: BC Managed Care – PPO | Admitting: Internal Medicine

## 2022-04-18 NOTE — Progress Notes (Signed)
Cardiology Clinic Note   Patient Name: Amanda Parrish Date of Encounter: 04/25/2022  Primary Care Provider:  Lorre Munroe, NP Primary Cardiologist:  None  Patient Profile    56 year old female with past medical history of hyperlipidemia, former smoker x 7 years, anxiety, who is here today for follow-up on previous complaints of chest discomfort.   Past Medical History    Past Medical History:  Diagnosis Date   Allergy    Anemia    Anxiety    Arthritis    hands and feet and hips    Depression    GERD (gastroesophageal reflux disease)    Hyperlipidemia    IBS (irritable bowel syndrome)    Status post dilation of esophageal narrowing    Past Surgical History:  Procedure Laterality Date   COLONOSCOPY     ENDOMETRIAL ABLATION  2007   Novsure   POLYPECTOMY     TA 2008   TOOTH EXTRACTION     TUBAL LIGATION     UPPER GASTROINTESTINAL ENDOSCOPY     stricture and had dilatation   WISDOM TOOTH EXTRACTION      Allergies  No Known Allergies  History of Present Illness    Amanda Parrish is a 56 year old female with a past medical history of hyperlipidemia, former smoker x 7 years, anxiety, and recent complaints of chest discomfort.  She was originally seen in clinic on 03/19/2022 by Dr. Azucena Cecil complaints of chest pain that have been ongoing for approximately a year.  She stated that her symptoms would typically wake her from sleep.  She did states she had some associated shortness of breath.  She was scheduled for an echocardiogram, a coronary CTA, and her Lipitor was increased to 80 mg daily.  Coronary CTA was completed and revealed coronary calcium score 4, normal coronary origin with right dominance, no evidence of CAD, consider nonatherosclerotic causes of chest pain.  Diagram revealed LVEF of 60-65%, no regional wall motion abnormalities, and no valvular abnormalities noted.  She returns to clinic today stating that she has had a few episodes  of chest discomfort since her last visit but she does not feel as if they were as intense as before. She has followed up with her GI provider and is scheduled to have esophogeal dilatation completed in the near future. She has recently noted some shortness of breath while climbing stairs, but thinks it is from being out of shape. No recent hospitalizations or visits to the emergency department.  Home Medications    Current Outpatient Medications  Medication Sig Dispense Refill   atorvastatin (LIPITOR) 80 MG tablet Take 1 tablet (80 mg total) by mouth daily. Take 1 tablet (40 mg total) by mouth daily at 6 PM. (Patient taking differently: Take 80 mg by mouth daily.) 30 tablet 3   buPROPion (WELLBUTRIN XL) 150 MG 24 hr tablet Take 2 tablets (300 mg total) by mouth daily. 60 tablet 0   clonazePAM (KLONOPIN) 1 MG tablet Take 1 tablet (1 mg total) by mouth daily as needed for anxiety. 30 tablet 0   escitalopram (LEXAPRO) 20 MG tablet Take 1 tablet (20 mg total) by mouth at bedtime. (Patient taking differently: Take 10 mg by mouth at bedtime.) 90 tablet 0   Magnesium 250 MG TABS Take 250 mg by mouth daily.     VITAMIN D PO Take 10,000 Units by mouth every other day.      No current facility-administered medications for this visit.  Family History    Family History  Problem Relation Age of Onset   Heart disease Father    Colon polyps Mother    Cancer Paternal Grandfather        Prostate   Prostate cancer Paternal Grandfather    Colon cancer Maternal Aunt    Breast cancer Paternal Aunt 64   Breast cancer Paternal Grandmother 2   Rectal cancer Neg Hx    Stomach cancer Neg Hx    She indicated that her mother is alive. She indicated that her father is alive. She indicated that the status of her paternal grandmother is unknown. She indicated that the status of her paternal grandfather is unknown. She indicated that the status of her maternal aunt is unknown. She indicated that the status of her  paternal aunt is unknown. She indicated that the status of her neg hx is unknown.  Social History    Social History   Socioeconomic History   Marital status: Married    Spouse name: Not on file   Number of children: Not on file   Years of education: Not on file   Highest education level: Not on file  Occupational History   Not on file  Tobacco Use   Smoking status: Former    Packs/day: 0.50    Types: Cigarettes   Smokeless tobacco: Never  Vaping Use   Vaping Use: Never used  Substance and Sexual Activity   Alcohol use: Yes    Comment: rarely   Drug use: No   Sexual activity: Yes    Birth control/protection: Surgical    Comment: BTL-1st intercourse 56 yo-More than 5 partners  Other Topics Concern   Not on file  Social History Narrative   Patient is married   She has 1 son and 1 daughter   She is a Solicitor of court-deputy clerk  in [place]      Former smoker 2 alcoholic beverages a month 1 or 2 caffeinated beverages daily   Social Determinants of Corporate investment banker Strain: Not on file  Food Insecurity: Not on file  Transportation Needs: Not on file  Physical Activity: Not on file  Stress: Not on file  Social Connections: Not on file  Intimate Partner Violence: Not on file     Review of Systems    General:  No chills, fever, night sweats or weight changes.  Cardiovascular:  Endorses chest pain, dyspnea on exertion, but denies edema, orthopnea, palpitations, paroxysmal nocturnal dyspnea. Dermatological: No rash, lesions/masses Respiratory: No cough, endorses exertional dyspnea Urologic: No hematuria, dysuria Abdominal:   No nausea, vomiting, diarrhea, bright red blood per rectum, melena, or hematemesis Neurologic:  No visual changes, wkns, changes in mental status. All other systems reviewed and are otherwise negative except as noted above.   Physical Exam    VS:  BP 116/60 (BP Location: Left Arm, Patient Position: Sitting, Cuff Size: Normal)    Pulse 69   Ht 5\' 7"  (1.702 m)   Wt 158 lb 12.8 oz (72 kg)   SpO2 98%   BMI 24.87 kg/m  , BMI Body mass index is 24.87 kg/m.     GEN: Well nourished, well developed, in no acute distress. HEENT: normal. Neck: Supple, no JVD, carotid bruits, or masses. Cardiac: RRR, no murmurs, rubs, or gallops. No clubbing, cyanosis, edema.  Radials 2+/PT 2+ and equal bilaterally.  Respiratory:  Respirations regular and unlabored, clear to auscultation bilaterally. GI: Soft, nontender, nondistended, BS + x 4. MS:  no deformity or atrophy. Skin: warm and dry, no rash. Neuro:  Strength and sensation are intact. Psych: Normal affect.  Accessory Clinical Findings    ECG personally reviewed by me today- No new tracings were completed today  Lab Results  Component Value Date   WBC 7.0 01/26/2022   HGB 14.5 01/26/2022   HCT 43.8 01/26/2022   MCV 95.2 01/26/2022   PLT 260 01/26/2022   Lab Results  Component Value Date   CREATININE 0.92 03/19/2022   BUN 25 (H) 03/19/2022   NA 138 03/19/2022   K 3.9 03/19/2022   CL 103 03/19/2022   CO2 28 03/19/2022   Lab Results  Component Value Date   ALT 13 01/26/2022   AST 18 01/26/2022   ALKPHOS 59 05/25/2019   BILITOT 0.6 01/26/2022   Lab Results  Component Value Date   CHOL 276 (H) 01/26/2022   HDL 76 01/26/2022   LDLCALC 183 (H) 01/26/2022   TRIG 65 01/26/2022   CHOLHDL 3.6 01/26/2022    Lab Results  Component Value Date   HGBA1C 4.9 01/12/2021    Assessment & Plan   1.  Chest pains that have decreased since her last visit with recent outpatient testing completed.  Coronary CTA completed with coronary calcium score 4, normal coronary arteries with right dominant, no evidence of CAD, consider nonatherosclerotic causes of chest pain.  She also had an echocardiogram that was completed which revealed an LVEF of 60 to 65%, no regional wall motion abnormalities, and no valvular abnormalities were noted.  She was encouraged to continue follow-up  with her gastroenterologist as she has a known hernia and had previously had her esophagus dilated approximately 12 years prior.  She would likely benefit from PPI and is awaiting for procedure with GI to see what his findings are.  2.  Pure hypercholesterolemia last LDL of 183 01/26/2022.  At her previous visit 11/23 her Lipitor was increased to 80 mg daily.  She has been scheduled for a repeat lipid and hepatic panel again in February after taking increased dose of Lipitor.  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  After labs have resulted can consider zetia or PCSK9i.  3.  Disposition she is return to clinic to see MD/APP in 3 months or sooner if needed  Yonis Carreon, NP 04/25/2022, 8:31 AM

## 2022-04-25 ENCOUNTER — Encounter: Payer: Self-pay | Admitting: Cardiology

## 2022-04-25 ENCOUNTER — Ambulatory Visit: Payer: BC Managed Care – PPO | Attending: Cardiology | Admitting: Cardiology

## 2022-04-25 VITALS — BP 116/60 | HR 69 | Ht 67.0 in | Wt 158.8 lb

## 2022-04-25 DIAGNOSIS — E7800 Pure hypercholesterolemia, unspecified: Secondary | ICD-10-CM

## 2022-04-25 DIAGNOSIS — R072 Precordial pain: Secondary | ICD-10-CM | POA: Diagnosis not present

## 2022-04-25 NOTE — Patient Instructions (Signed)
Medication Instructions:   No medication changes today.  *If you need a refill on your cardiac medications before your next appointment, please call your pharmacy*   Lab Work:  Your physician recommends that you return for lab work in February 2024 at [facility].  -Fasting Lipid and Hepatic Panel  If you have labs (blood work) drawn today and your tests are completely normal, you will receive your results only by: MyChart Message (if you have MyChart) OR A paper copy in the mail If you have any lab test that is abnormal or we need to change your treatment, we will call you to review the results.   Testing/Procedures:  No additional testing.   Follow-Up: At [facility], you and your health needs are our priority.  As part of our continuing mission to provide you with exceptional heart care, we have created designated Provider Care Teams.  These Care Teams include your primary Cardiologist (physician) and Advanced Practice Providers (APPs -  Physician Assistants and Nurse Practitioners) who all work together to provide you with the care you need, when you need it.  We recommend signing up for the patient portal called "MyChart".  Sign up information is provided on this After Visit Summary.  MyChart is used to connect with patients for Virtual Visits (Telemedicine).  Patients are able to view lab/test results, encounter notes, upcoming appointments, etc.  Non-urgent messages can be sent to your provider as well.   To learn more about what you can do with MyChart, go to [email or web address].    Your next appointment:   3 month(s)  The format for your next appointment:   In Person  Provider:   You may see or one of the following Advanced Practice Providers on your designated Care Team:   Nicolasa Ducking, NP Eula Listen, PA-C Cadence Fransico Michael, PA-C Charlsie Quest, NP     Important Information About Sugar

## 2022-05-02 ENCOUNTER — Other Ambulatory Visit: Payer: Self-pay | Admitting: Internal Medicine

## 2022-05-05 ENCOUNTER — Encounter: Payer: Self-pay | Admitting: Internal Medicine

## 2022-05-07 ENCOUNTER — Ambulatory Visit (INDEPENDENT_AMBULATORY_CARE_PROVIDER_SITE_OTHER): Payer: BC Managed Care – PPO | Admitting: Internal Medicine

## 2022-05-07 ENCOUNTER — Encounter: Payer: Self-pay | Admitting: Internal Medicine

## 2022-05-07 VITALS — BP 122/78 | HR 69 | Temp 96.6°F | Ht 66.0 in | Wt 156.0 lb

## 2022-05-07 DIAGNOSIS — Z0001 Encounter for general adult medical examination with abnormal findings: Secondary | ICD-10-CM

## 2022-05-07 DIAGNOSIS — E663 Overweight: Secondary | ICD-10-CM

## 2022-05-07 DIAGNOSIS — Z23 Encounter for immunization: Secondary | ICD-10-CM | POA: Diagnosis not present

## 2022-05-07 DIAGNOSIS — Z1231 Encounter for screening mammogram for malignant neoplasm of breast: Secondary | ICD-10-CM | POA: Diagnosis not present

## 2022-05-07 DIAGNOSIS — E7800 Pure hypercholesterolemia, unspecified: Secondary | ICD-10-CM

## 2022-05-07 DIAGNOSIS — Z6825 Body mass index (BMI) 25.0-25.9, adult: Secondary | ICD-10-CM | POA: Insufficient documentation

## 2022-05-07 MED ORDER — BUPROPION HCL ER (XL) 150 MG PO TB24: 300.0000 mg | ORAL_TABLET | Freq: Every day | ORAL | 1 refills | Status: DC

## 2022-05-07 NOTE — Progress Notes (Signed)
Subjective:    Patient ID: Amanda Parrish, female    DOB: May 25, 1965, 57 y.o.   MRN: [id number]  HPI  Patient presents to clinic today for her annual exam.  Flu: 05/2019 Tetanus: 05/2019 COVID: Pfizer x 2 Shingrix: Never Pap smear: 11/2018 Mammogram: 05/2018 Colon screening: 08/2015 Vision screening: annually Dentist: biannually  Diet: She does eat lean meat. She consumes fruits and veggies. She tries to avoid fried foods. She drinks mostly water and coffee. Exercise: None  Review of Systems     Past Medical History:  Diagnosis Date   Allergy    Anemia    Anxiety    Arthritis    hands and feet and hips    Depression    GERD (gastroesophageal reflux disease)    Hyperlipidemia    IBS (irritable bowel syndrome)    Status post dilation of esophageal narrowing     Current Outpatient Medications  Medication Sig Dispense Refill   atorvastatin (LIPITOR) 80 MG tablet Take 1 tablet (80 mg total) by mouth daily. Take 1 tablet (40 mg total) by mouth daily at 6 PM. (Patient taking differently: Take 80 mg by mouth daily.) 30 tablet 3   buPROPion (WELLBUTRIN XL) 150 MG 24 hr tablet Take 2 tablets (300 mg total) by mouth daily. 60 tablet 0   clonazePAM (KLONOPIN) 1 MG tablet Take 1 tablet (1 mg total) by mouth daily as needed for anxiety. 30 tablet 0   escitalopram (LEXAPRO) 20 MG tablet Take 1 tablet (20 mg total) by mouth at bedtime. (Patient taking differently: Take 10 mg by mouth at bedtime.) 90 tablet 0   Magnesium 250 MG TABS Take 250 mg by mouth daily.     VITAMIN D PO Take 10,000 Units by mouth every other day.      No current facility-administered medications for this visit.    No Known Allergies  Family History  Problem Relation Age of Onset   Heart disease Father    Colon polyps Mother    Cancer Paternal Grandfather        Prostate   Prostate cancer Paternal Grandfather    Colon cancer Maternal Aunt    Breast cancer Paternal Aunt 85   Breast cancer Paternal  Grandmother 50   Rectal cancer Neg Hx    Stomach cancer Neg Hx     Social History   Socioeconomic History   Marital status: Married    Spouse name: Not on file   Number of children: Not on file   Years of education: Not on file   Highest education level: Not on file  Occupational History   Not on file  Tobacco Use   Smoking status: Former    Packs/day: 0.50    Types: Cigarettes   Smokeless tobacco: Never  Vaping Use   Vaping Use: Never used  Substance and Sexual Activity   Alcohol use: Yes    Comment: rarely   Drug use: No   Sexual activity: Yes    Birth control/protection: Surgical    Comment: BTL-1st intercourse 57 yo-More than 5 partners  Other Topics Concern   Not on file  Social History Narrative   Patient is married   She has 1 son and 1 daughter   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  HEENT: Denies eye pain, eye redness, ear pain, ringing in the ears, wax buildup, runny nose, nasal congestion, bloody nose, or sore throat. Respiratory: Denies difficulty breathing, shortness of breath, cough or sputum production.   Cardiovascular: Denies chest pain, chest tightness, palpitations or swelling in the hands or feet.  Gastrointestinal: Pt reports dysphagia, intermittent constipation. Denies abdominal pain, bloating, diarrhea or blood in the stool.  GU: Denies urgency, frequency, pain with urination, burning sensation, blood in urine, odor or discharge. Musculoskeletal: Denies decrease in range of motion, difficulty  with gait, muscle pain or joint pain and swelling.  Skin: Denies redness, rashes, lesions or ulcercations.  Neurological: Denies dizziness, difficulty with memory, difficulty with speech or problems with balance and coordination.  Psych: Patient has a history of anxiety and depression.  Denies SI/HI.  No other specific complaints in a complete review of systems (except as listed in HPI above).  Objective:   Physical Exam  BP 122/78 (BP Location: Left Arm, Patient Position: Sitting, Cuff Size: Normal)   Pulse 69   Temp (!) 96.6 F (35.9 C) (Temporal)   Ht 5\' 6"  (1.676 m)   Wt 156 lb (70.8 kg)   SpO2 98%   BMI 25.18 kg/m   Wt Readings from Last 3 Encounters:  04/25/22 158 lb 12.8 oz (72 kg)  04/10/22 161 lb (73 kg)  03/19/22 164 lb (74.4 kg)    General: Appears her stated age, overweight, in NAD. Skin: Warm, dry and intact.  HEENT: Head: normal shape and size; Eyes: sclera white, no icterus, conjunctiva pink, PERRLA and EOMs intact;  Neck:  Neck supple, trachea midline. No masses, lumps or thyromegaly present.  Cardiovascular: Normal rate and rhythm. S1,S2 noted.  No murmur, rubs or gallops noted. No JVD or BLE edema. No carotid bruits noted. Pulmonary/Chest: Normal effort and positive vesicular breath sounds. No respiratory distress. No wheezes, rales or ronchi noted.  Abdomen: Normal bowel sounds. Musculoskeletal: Strength 5/5 BUE/BLE.  No difficulty with gait.  Neurological: Alert and oriented. Cranial nerves II-XII grossly intact. Coordination normal.  Psychiatric: Mood and affect normal. Behavior is normal. Judgment and thought content normal.     BMET    Component Value Date/Time   NA 138 03/19/2022 1030   K 3.9 03/19/2022 1030   CL 103 03/19/2022 1030   CO2 28 03/19/2022 1030   GLUCOSE 93 03/19/2022 1030   BUN 25 (H) 03/19/2022 1030   CREATININE 0.92 03/19/2022 1030   CREATININE 0.99 01/26/2022 1044   CALCIUM 9.6 03/19/2022 1030   GFRNONAA >60 03/19/2022 1030     Lipid Panel     Component Value Date/Time   CHOL 276 (H) 01/26/2022 1044   TRIG 65 01/26/2022 1044   HDL 76 01/26/2022 1044   CHOLHDL 3.6 01/26/2022 1044   VLDL 12.4 05/25/2019 1137   LDLCALC 183 (H) 01/26/2022 1044    CBC    Component Value Date/Time   WBC 7.0 01/26/2022 1044   RBC 4.60 01/26/2022 1044   HGB 14.5 01/26/2022 1044   HCT 43.8 01/26/2022 1044   HCT 41 10/16/2017 0000   PLT 260 01/26/2022 1044   MCV 95.2 01/26/2022 1044   MCH 31.5 01/26/2022 1044  MCHC 33.1 01/26/2022 1044   RDW 12.0 01/26/2022 1044   LYMPHSABS 1,666 01/26/2022 1044   MONOABS 0.4 06/21/2015 0831   EOSABS 140 01/26/2022 1044   BASOSABS 49 01/26/2022 1044    Hgb A1C Lab Results  Component Value Date   HGBA1C 4.9 01/12/2021            Assessment & Plan:   Preventative Health Maintenance:  Flu shot today Tetanus UTD Encouraged her to get her COVID booster Discussed Shingrix vaccine, will check coverage with her insurance company and schedule a nurse visit if she would like to have this done Pap smear UTD Mammogram ordered-she will call to schedule Colon screening UTD Encouraged her to consume a balanced diet and exercise regimen Advised her to see an eye doctor and dentist annually We will check CBC, c-Met and lipid profile today  RTC in 6 months, follow-up chronic conditions Webb Silversmith, NP

## 2022-05-07 NOTE — Telephone Encounter (Signed)
Pt has appointment 05/07/2022

## 2022-05-07 NOTE — Addendum Note (Signed)
Addended by: Ashley Royalty E on: 05/07/2022 10:15 AM   Modules accepted: Orders

## 2022-05-07 NOTE — Patient Instructions (Signed)
Health Maintenance for Postmenopausal Women Menopause is a normal process in which your ability to get pregnant comes to an end. This process happens slowly over many months or years, usually between the ages of 48 and 55. Menopause is complete when you have missed your menstrual period for 12 months. It is important to talk with your health care provider about some of the most common conditions that affect women after menopause (postmenopausal women). These include heart disease, cancer, and bone loss (osteoporosis). Adopting a healthy lifestyle and getting preventive care can help to promote your health and wellness. The actions you take can also lower your chances of developing some of these common conditions. What are the signs and symptoms of menopause? During menopause, you may have the following symptoms: Hot flashes. These can be moderate or severe. Night sweats. Decrease in sex drive. Mood swings. Headaches. Tiredness (fatigue). Irritability. Memory problems. Problems falling asleep or staying asleep. Talk with your health care provider about treatment options for your symptoms. Do I need hormone replacement therapy? Hormone replacement therapy is effective in treating symptoms that are caused by menopause, such as hot flashes and night sweats. Hormone replacement carries certain risks, especially as you become older. If you are thinking about using estrogen or estrogen with progestin, discuss the benefits and risks with your health care provider. How can I reduce my risk for heart disease and stroke? The risk of heart disease, heart attack, and stroke increases as you age. One of the causes may be a change in the body's hormones during menopause. This can affect how your body uses dietary fats, triglycerides, and cholesterol. Heart attack and stroke are medical emergencies. There are many things that you can do to help prevent heart disease and stroke. Watch your blood pressure High  blood pressure causes heart disease and increases the risk of stroke. This is more likely to develop in people who have high blood pressure readings or are overweight. Have your blood pressure checked: Every 3-5 years if you are 18-39 years of age. Every year if you are 40 years old or older. Eat a healthy diet  Eat a diet that includes plenty of vegetables, fruits, low-fat dairy products, and lean protein. Do not eat a lot of foods that are high in solid fats, added sugars, or sodium. Get regular exercise Get regular exercise. This is one of the most important things you can do for your health. Most adults should: Try to exercise for at least 150 minutes each week. The exercise should increase your heart rate and make you sweat (moderate-intensity exercise). Try to do strengthening exercises at least twice each week. Do these in addition to the moderate-intensity exercise. Spend less time sitting. Even light physical activity can be beneficial. Other tips Work with your health care provider to achieve or maintain a healthy weight. Do not use any products that contain nicotine or tobacco. These products include cigarettes, chewing tobacco, and vaping devices, such as e-cigarettes. If you need help quitting, ask your health care provider. Know your numbers. Ask your health care provider to check your cholesterol and your blood sugar (glucose). Continue to have your blood tested as directed by your health care provider. Do I need screening for cancer? Depending on your health history and family history, you may need to have cancer screenings at different stages of your life. This may include screening for: Breast cancer. Cervical cancer. Lung cancer. Colorectal cancer. What is my risk for osteoporosis? After menopause, you may be   at increased risk for osteoporosis. Osteoporosis is a condition in which bone destruction happens more quickly than new bone creation. To help prevent osteoporosis or  the bone fractures that can happen because of osteoporosis, you may take the following actions: If you are 19-50 years old, get at least 1,000 mg of calcium and at least 600 international units (IU) of vitamin D per day. If you are older than age 50 but younger than age 70, get at least 1,200 mg of calcium and at least 600 international units (IU) of vitamin D per day. If you are older than age 70, get at least 1,200 mg of calcium and at least 800 international units (IU) of vitamin D per day. Smoking and drinking excessive alcohol increase the risk of osteoporosis. Eat foods that are rich in calcium and vitamin D, and do weight-bearing exercises several times each week as directed by your health care provider. How does menopause affect my mental health? Depression may occur at any age, but it is more common as you become older. Common symptoms of depression include: Feeling depressed. Changes in sleep patterns. Changes in appetite or eating patterns. Feeling an overall lack of motivation or enjoyment of activities that you previously enjoyed. Frequent crying spells. Talk with your health care provider if you think that you are experiencing any of these symptoms. General instructions See your health care provider for regular wellness exams and vaccines. This may include: Scheduling regular health, dental, and eye exams. Getting and maintaining your vaccines. These include: Influenza vaccine. Get this vaccine each year before the flu season begins. Pneumonia vaccine. Shingles vaccine. Tetanus, diphtheria, and pertussis (Tdap) booster vaccine. Your health care provider may also recommend other immunizations. Tell your health care provider if you have ever been abused or do not feel safe at home. Summary Menopause is a normal process in which your ability to get pregnant comes to an end. This condition causes hot flashes, night sweats, decreased interest in sex, mood swings, headaches, or lack  of sleep. Treatment for this condition may include hormone replacement therapy. Take actions to keep yourself healthy, including exercising regularly, eating a healthy diet, watching your weight, and checking your blood pressure and blood sugar levels. Get screened for cancer and depression. Make sure that you are up to date with all your vaccines. This information is not intended to replace advice given to you by your health care provider. Make sure you discuss any questions you have with your health care provider. Document Revised: 09/05/2020 Document Reviewed: 09/05/2020 Elsevier Patient Education  2023 [employer]  

## 2022-05-07 NOTE — Assessment & Plan Note (Signed)
Encourage diet and exercise for weight loss 

## 2022-05-08 LAB — CBC
HCT: 42.5 % (ref 35.0–45.0)
Hemoglobin: 14.5 g/dL (ref 11.7–15.5)
MCH: 31.6 pg (ref 27.0–33.0)
MCHC: 34.1 g/dL (ref 32.0–36.0)
MCV: 92.6 fL (ref 80.0–100.0)
MPV: 10.5 fL (ref 7.5–12.5)
Platelets: 228 10*3/uL (ref 140–400)
RBC: 4.59 10*6/uL (ref 3.80–5.10)
RDW: 12.2 % (ref 11.0–15.0)
WBC: 5 10*3/uL (ref 3.8–10.8)

## 2022-05-08 LAB — LIPID PANEL
Cholesterol: 224 mg/dL — ABNORMAL HIGH (ref <200)
HDL: 47 mg/dL — ABNORMAL LOW (ref >=50)
LDL Cholesterol (Calc): 158 mg/dL (calc) — ABNORMAL HIGH
Non-HDL Cholesterol (Calc): 177 mg/dL (calc) — ABNORMAL HIGH (ref <130)
Total CHOL/HDL Ratio: 4.8 (calc) (ref <5.0)
Triglycerides: 87 mg/dL (ref <150)

## 2022-05-08 LAB — COMPLETE METABOLIC PANEL WITH GFR
AG Ratio: 1.7 (calc) (ref 1.0–2.5)
ALT: 12 U/L (ref 6–29)
AST: 15 U/L (ref 10–35)
Albumin: 4.5 g/dL (ref 3.6–5.1)
Alkaline phosphatase (APISO): 88 U/L (ref 37–153)
BUN: 19 mg/dL (ref 7–25)
CO2: 27 mmol/L (ref 20–32)
Calcium: 9.8 mg/dL (ref 8.6–10.4)
Chloride: 105 mmol/L (ref 98–110)
Creat: 0.94 mg/dL (ref 0.50–1.03)
Globulin: 2.6 g/dL (calc) (ref 1.9–3.7)
Glucose, Bld: 92 mg/dL (ref 65–99)
Potassium: 4.1 mmol/L (ref 3.5–5.3)
Sodium: 141 mmol/L (ref 135–146)
Total Bilirubin: 0.6 mg/dL (ref 0.2–1.2)
Total Protein: 7.1 g/dL (ref 6.1–8.1)
eGFR: 72 mL/min/{1.73_m2} (ref >=60)

## 2022-05-10 NOTE — Progress Notes (Signed)
Maybe education on the auto-injector will be easier for her tolerate. Since the auto-injector is not like giving shots with needles.

## 2022-05-11 ENCOUNTER — Ambulatory Visit (AMBULATORY_SURGERY_CENTER): Payer: BC Managed Care – PPO | Admitting: Internal Medicine

## 2022-05-11 ENCOUNTER — Encounter: Payer: Self-pay | Admitting: Internal Medicine

## 2022-05-11 ENCOUNTER — Telehealth: Payer: Self-pay | Admitting: *Deleted

## 2022-05-11 VITALS — BP 102/63 | HR 54 | Temp 97.1°F | Resp 12 | Ht 66.0 in | Wt 161.0 lb

## 2022-05-11 DIAGNOSIS — K2970 Gastritis, unspecified, without bleeding: Secondary | ICD-10-CM | POA: Diagnosis not present

## 2022-05-11 DIAGNOSIS — R1319 Other dysphagia: Secondary | ICD-10-CM | POA: Diagnosis not present

## 2022-05-11 DIAGNOSIS — K2990 Gastroduodenitis, unspecified, without bleeding: Secondary | ICD-10-CM | POA: Diagnosis not present

## 2022-05-11 DIAGNOSIS — K319 Disease of stomach and duodenum, unspecified: Secondary | ICD-10-CM | POA: Diagnosis not present

## 2022-05-11 MED ORDER — SODIUM CHLORIDE 0.9 % IV SOLN: 500.0000 mL | Freq: Once | INTRAVENOUS | Status: DC

## 2022-05-11 NOTE — Progress Notes (Signed)
Report to PACU, RN, vss, BBS= Clear.  

## 2022-05-11 NOTE — Patient Instructions (Addendum)
There were a few areas of inflammation in the stomach - I took biopsies. I dilated the esophagus also. Once I get the results will make recommendations.  I appreciate the opportunity to care for you. Gatha Mayer, MD, FACG  YOU HAD AN ENDOSCOPIC PROCEDURE TODAY AT [facility]:   Refer to the procedure report that was given to you for any specific questions about what was found during the examination.  If the procedure report does not answer your questions, please call your gastroenterologist to clarify.  If you requested that your care partner not be given the details of your procedure findings, then the procedure report has been included in a sealed envelope for you to review at your convenience later.  YOU SHOULD EXPECT: Some feelings of bloating in the abdomen. Passage of more gas than usual.  Walking can help get rid of the air that was put into your GI tract during the procedure and reduce the bloating.   Please Note:  You might notice some irritation and congestion in your nose or some drainage.  This is from the oxygen used during your procedure.  There is no need for concern and it should clear up in a day or so.  SYMPTOMS TO REPORT IMMEDIATELY:  Following upper endoscopy (EGD)  Vomiting of blood or coffee ground material  New chest pain or pain under the shoulder blades  Painful or persistently difficult swallowing  New shortness of breath  Fever of 100F or higher  Black, tarry-looking stools  For urgent or emergent issues, a gastroenterologist can be reached at any hour by calling [phone]. Do not use MyChart messaging for urgent concerns.    DIET:  We do recommend a small meal at first, but then you may proceed to your regular diet.  Drink plenty of fluids but you should avoid alcoholic beverages for 24 hours.  ACTIVITY:  You should plan to take it easy for the rest of today and you should NOT DRIVE or use heavy machinery until tomorrow (because of  the sedation medicines used during the test).    FOLLOW UP: Our staff will call the number listed on your records the next business day following your procedure.  We will call around 7:15- 8:00 am to check on you and address any questions or concerns that you may have regarding the information given to you following your procedure. If we do not reach you, we will leave a message.     If any biopsies were taken you will be contacted by phone or by letter within the next 1-3 weeks.  Please call us at [phone] if you have not heard about the biopsies in 3 weeks.    SIGNATURES/CONFIDENTIALITY: You and/or your care partner have signed paperwork which will be entered into your electronic medical record.  These signatures attest to the fact that that the information above on your After Visit Summary has been reviewed and is understood.  Full responsibility of the confidentiality of this discharge information lies with you and/or your care-partner.

## 2022-05-11 NOTE — Progress Notes (Signed)
History and Physical Interval Note:  05/11/2022 11:09 AM  Amanda Parrish  has presented today for endoscopic procedure(s), with the diagnosis of  Encounter Diagnosis  Name Primary?   Esophageal dysphagia Yes  .  The various methods of evaluation and treatment have been discussed with the patient and/or family. After consideration of risks, benefits and other options for treatment, the patient has consented to  the endoscopic procedure(s).   The patient's history has been reviewed, patient examined, no change in status, stable for endoscopic procedure(s).  I have reviewed the patient's chart and labs.  Questions were answered to the patient's satisfaction.     Gatha Mayer, MD, [facility]

## 2022-05-11 NOTE — Progress Notes (Signed)
Called to room to assist during endoscopic procedure.  Patient ID and intended procedure confirmed with present staff. Received instructions for my participation in the procedure from the performing physician.  

## 2022-05-11 NOTE — Telephone Encounter (Signed)
-----  Message from Gerrie Nordmann, NP sent at 05/10/2022  4:52 PM EST ----- Maybe education on the auto-injector will be easier for her tolerate. Since the auto-injector is not like giving shots with needles.

## 2022-05-11 NOTE — Telephone Encounter (Signed)
Left voicemail message for her to call back for further review of recommendations.

## 2022-05-11 NOTE — Op Note (Signed)
[facility] Patient Name: Amanda Parrish Procedure Date: 05/11/2022 11:07 AM MRN: [id number] Endoscopist: Iva Boop , MD, [id number] Age: 57 Referring MD:  Date of Birth: [birth year] Gender: Female Account #: [id number] Procedure:                Upper GI endoscopy Indications:              Dysphagia Medicines:                Monitored Anesthesia Care Procedure:                Pre-Anesthesia Assessment:                           - Prior to the procedure, a History and Physical                            was performed, and patient medications and                            allergies were reviewed. The patient's tolerance of                            previous anesthesia was also reviewed. The risks                            and benefits of the procedure and the sedation                            options and risks were discussed with the patient.                            All questions were answered, and informed consent                            was obtained. Prior Anticoagulants: The patient has                            taken no anticoagulant or antiplatelet agents. ASA                            Grade Assessment: II - A patient with mild systemic                            disease. After reviewing the risks and benefits,                            the patient was deemed in satisfactory condition to                            undergo the procedure.                           After obtaining informed consent, the endoscope was  passed under direct vision. Throughout the                            procedure, the patient's blood pressure, pulse, and                            oxygen saturations were monitored continuously. The                            GIF [id number] was introduced through the                            mouth, and advanced to the second part of duodenum.                            The upper GI endoscopy was  accomplished without                            difficulty. The patient tolerated the procedure                            well. Scope In: Scope Out: Findings:                 No endoscopic abnormality was evident in the                            esophagus to explain the patient's complaint of                            dysphagia. It was decided, however, to proceed with                            dilation of the entire esophagus. The scope was                            withdrawn. Dilation was performed with a Maloney                            dilator with mild resistance at 72 Fr. The dilation                            site was examined following endoscope reinsertion                            and showed no change. Estimated blood loss: none.                           Multiple small erosions with no stigmata of recent                            bleeding were found in the prepyloric region of the  stomach. Biopsies were taken with a cold forceps                            for histology. Verification of patient                            identification for the specimen was done. Estimated                            blood loss was minimal.                           The exam was otherwise without abnormality.                           The cardia and gastric fundus were normal on                            retroflexion. Complications:            No immediate complications. Estimated Blood Loss:     Estimated blood loss was minimal. Impression:               - No endoscopic esophageal abnormality to explain                            patient's dysphagia. Esophagus dilated. Dilated.                           - Erosive gastropathy with no stigmata of recent                            bleeding. Biopsied.                           - The examination was otherwise normal. Recommendation:           - Patient has a contact number available for                             emergencies. The signs and symptoms of potential                            delayed complications were discussed with the                            patient. Return to normal activities tomorrow.                            Written discharge instructions were provided to the                            patient.                           - Clear liquids x 1 hour then soft foods rest of  day. Start prior diet tomorrow.                           - Continue present medications.                           - Await pathology results. Iva Boop, MD 05/11/2022 11:34:33 AM This report has been signed electronically.

## 2022-05-14 ENCOUNTER — Telehealth: Payer: Self-pay | Admitting: *Deleted

## 2022-05-14 NOTE — Telephone Encounter (Signed)
  Follow up Call-     05/11/2022    9:28 AM  Call back number  Post procedure Call Back phone  # [phone]  Permission to leave phone message Yes     Patient questions:  Do you have a fever, pain , or abdominal swelling? No. Pain Score  0 *  Have you tolerated food without any problems? Yes.    Have you been able to return to your normal activities? Yes.    Do you have any questions about your discharge instructions: Diet   No. Medications  No. Follow up visit  No.  Do you have questions or concerns about your Care? No.  Actions: * If pain score is 4 or above: No action needed, pain <4.

## 2022-05-15 NOTE — Telephone Encounter (Signed)
See other telephone/my chart messages regarding this medication.

## 2022-05-16 ENCOUNTER — Encounter: Payer: Self-pay | Admitting: Internal Medicine

## 2022-05-17 ENCOUNTER — Other Ambulatory Visit: Payer: Self-pay | Admitting: *Deleted

## 2022-05-17 DIAGNOSIS — E7800 Pure hypercholesterolemia, unspecified: Secondary | ICD-10-CM

## 2022-05-17 MED ORDER — REPATHA SURECLICK 140 MG/ML ~~LOC~~ SOAJ: 140.0000 mg | SUBCUTANEOUS | 6 refills | Status: DC

## 2022-05-18 ENCOUNTER — Encounter: Payer: Self-pay | Admitting: Physician Assistant

## 2022-05-18 ENCOUNTER — Ambulatory Visit: Payer: BC Managed Care – PPO | Admitting: Physician Assistant

## 2022-05-18 ENCOUNTER — Ambulatory Visit (INDEPENDENT_AMBULATORY_CARE_PROVIDER_SITE_OTHER): Payer: BC Managed Care – PPO | Admitting: Physician Assistant

## 2022-05-18 ENCOUNTER — Telehealth: Payer: Self-pay

## 2022-05-18 VITALS — BP 132/76 | HR 66 | Temp 97.1°F | Wt 155.0 lb

## 2022-05-18 DIAGNOSIS — M436 Torticollis: Secondary | ICD-10-CM

## 2022-05-18 MED ORDER — MELOXICAM 15 MG PO TABS: 15.0000 mg | ORAL_TABLET | Freq: Every day | ORAL | 0 refills | Status: DC

## 2022-05-18 NOTE — Telephone Encounter (Signed)
Prior Authorization initiated in [email or web address] for Repatha 140 mg.   KEY: [id number]  Response: The request has received an approval. As long as you remain covered by the [facility] and there are no changes to your plan benefits, this request is approved for the following time period: 05/18/2022 - 05/18/2023

## 2022-05-18 NOTE — Progress Notes (Signed)
Acute Office Visit   Patient: Amanda Parrish   DOB: 1965/10/29   57 y.o. Female  MRN: [id number] Visit Date: 05/18/2022  Today's healthcare provider: Dani Gobble Lory Galan, PA-C  Introduced myself to the patient as a Journalist, newspaper and provided education on APPs in clinical practice.    Chief Complaint  Patient presents with   Neck Pain    Ongoing for about 2 weeks.   Subjective    HPI HPI     Neck Pain    Additional comments: Ongoing for about 2 weeks.      Last edited by Almon Register, PA-C on 05/18/2022  2:17 PM.       Concern for Neck Pain  Onset: sudden - woke up with it and thought she has slept wrong  Duration: Ongoing for about 2 weeks  Location: right side of neck  Radiation: Some pain in scalp on the right side  Pain level and character: 6/10 today but has gotten worse  Associated symptoms: Denies numbness or tingling in hands or arms, denies radiation into shoulders or back  Interventions: Cold and warm compresses, icy hot, biofreeze  Alleviating: Icyhot provided mild relief Aggravating: lifting head off her pillow, turning head to the right      Medications: Outpatient Medications Prior to Visit  Medication Sig   atorvastatin (LIPITOR) 80 MG tablet Take 1 tablet (80 mg total) by mouth daily. Take 1 tablet (40 mg total) by mouth daily at 6 PM. (Patient taking differently: Take 80 mg by mouth daily.)   buPROPion (WELLBUTRIN XL) 150 MG 24 hr tablet Take 2 tablets (300 mg total) by mouth daily.   clonazePAM (KLONOPIN) 1 MG tablet Take 1 tablet (1 mg total) by mouth daily as needed for anxiety.   escitalopram (LEXAPRO) 10 MG tablet Take 10 mg by mouth daily.   Evolocumab (REPATHA SURECLICK) 154 MG/ML SOAJ Inject 140 mg into the skin every 14 (fourteen) days.   Magnesium 250 MG TABS Take 250 mg by mouth daily.   VITAMIN D PO Take 10,000 Units by mouth every other day.    No facility-administered medications prior to visit.    Review of Systems   Constitutional:  Negative for chills, fatigue and fever.  HENT:  Negative for rhinorrhea.   Eyes:  Negative for pain and discharge.  Gastrointestinal:  Negative for nausea and vomiting.  Musculoskeletal:  Positive for neck pain.  Neurological:  Positive for headaches. Negative for tremors, weakness and numbness.       Objective    BP 132/76 (BP Location: Right Arm, Patient Position: Sitting, Cuff Size: Normal)   Pulse 66   Temp (!) 97.1 F (36.2 C) (Temporal)   Wt 155 lb (70.3 kg)   SpO2 100%   BMI 25.02 kg/m    Physical Exam Vitals reviewed.  Constitutional:      General: She is awake.     Appearance: Normal appearance. She is well-developed and well-groomed.  HENT:     Head: Normocephalic and atraumatic.  Eyes:     Extraocular Movements: Extraocular movements intact.     Conjunctiva/sclera: Conjunctivae normal.  Neck:     Comments: Cervical ROM findings: Decreased ROM with lateral flexion on both sides Decreased rotation to each side but especially painful with turning to right Painful extension but ROM appears intact Painful flexion but ROM is mostly intact  Musculoskeletal:     Cervical back: Rigidity, spasms, torticollis and tenderness present. No  swelling, edema, deformity, erythema, signs of trauma, bony tenderness or crepitus. Pain with movement and muscular tenderness present. No spinous process tenderness. Decreased range of motion.  Lymphadenopathy:     Cervical:     Right cervical: No superficial, deep or posterior cervical adenopathy.    Left cervical: No superficial, deep or posterior cervical adenopathy.  Neurological:     General: No focal deficit present.     Mental Status: She is alert and oriented to person, place, and time. Mental status is at baseline.  Psychiatric:        Mood and Affect: Mood normal.        Behavior: Behavior normal. Behavior is cooperative.        Thought Content: Thought content normal.       No results found for any  visits on 05/18/22.  Assessment & Plan       Problem List Items Addressed This Visit   None Visit Diagnoses     Torticollis, acute    -  Primary Acute, new concern Reports right-sided neck pain and reduced ROM for about 2 weeks that is not improving with home measures consisting of topical pain relief and warm/cold compresses PE is suggestive of torticollis today but cannot definitively rule out cervical muscle strain either Recommend she continue with warm compresses, gentle stretches (included in AVS) and massage, topical Voltaren and Lidocaine patches, NSAIDs and Tylenol Will provide script for Meloxicam 15 mg PO QD to take the place of OTC NSAID- encouraged her to drink plenty of water while using  Follow up as needed for persistent or progressing symptoms     Relevant Medications   meloxicam (MOBIC) 15 MG tablet        No follow-ups on file.   I, Maribel Luis E Juleah Paradise, PA-C, have reviewed all documentation for this visit. The documentation on 05/18/22 for the exam, diagnosis, procedures, and orders are all accurate and complete.   Talitha Givens, MHS, PA-C [facility]

## 2022-05-18 NOTE — Patient Instructions (Addendum)
Based on your symptoms and physical exam I believe the following is the cause of your concern today Acute Torticollis or muscle contraction of the neck   Rest Warm compresses to the area (20 minutes on, minimum of 30 minutes off) You can alternate Tylenol and Ibuprofen for pain management but Ibuprofen is typically preferred to reduce inflammation.  I have sent in a script for Meloxicam which is an NSAID- like Ibuprofen, you do not need to take other NSAIDs while taking this. Make sure you are drinking plenty of water during the day while using it.  You can also try using a Lidocaine patch, topical pain relievers like IcyHot, Biofreeze or Voltaren on the area.  Gentle stretches and exercises that I have included in your paperwork Try to reduce excess strain to the area and rest as much as possible   If these measures do not lead to improvement in your symptoms over the next 2-4 weeks please let us know

## 2022-05-20 ENCOUNTER — Encounter: Payer: Self-pay | Admitting: Internal Medicine

## 2022-05-21 ENCOUNTER — Ambulatory Visit: Payer: BC Managed Care – PPO | Admitting: Internal Medicine

## 2022-07-24 NOTE — Progress Notes (Deleted)
  Cardiology Office Note:   Date:  07/24/2022  ID:  Amanda Parrish, DOB 24-Mar-1966, MRN [id number]  History of Present Illness:   Amanda Parrish is a 57 y.o. female with past medical history of hyperlipidemia, former smoker x 7 years, and anxiety.  She was last seen in clinic 04/25/2022 with recent complaints of chest discomfort.  She returns to clinic few episodes of chest discomfort but did not evaluate this before.  She has followed up with GI.  Was scheduled for esophageal dilatation completed in the near future.  At that time she agreed she would likely benefit from PPI therapy and was waiting until after esophageal dilatation prior to starting anything.  She was found to have an LDL of 193 and was started on Repatha.  ROS: ***  Studies Reviewed:    EKG:  *** TTE 04/10/22 1. Left ventricular ejection fraction, by estimation, is 60 to 65%. The  left ventricle has normal function. The left ventricle has no regional  wall motion abnormalities. Left ventricular diastolic parameters were  normal.   2. Right ventricular systolic function is normal. The right ventricular  size is normal. There is normal pulmonary artery systolic pressure. The  estimated right ventricular systolic pressure is 9.5 mmHg.   3. The mitral valve is normal in structure. No evidence of mitral valve  regurgitation. No evidence of mitral stenosis.   4. The aortic valve is tricuspid. Aortic valve regurgitation is not  visualized. No aortic stenosis is present.   5. The inferior vena cava is normal in size with greater than 50%  respiratory variability, suggesting right atrial pressure of 3 mmHg.   Coronary CTA 04/09/22 1. Coronary calcium score of 4.   2. Normal coronary origin with right dominance.   3. No evidence of CAD.   4. Consider non-atherosclerotic causes of chest pain.  Risk Assessment/Calculations:     No BP recorded.  {Refresh Note OR Click here to enter BP  :1}***        Physical  Exam:   VS:  There were no vitals taken for this visit.   Wt Readings from Last 3 Encounters:  05/18/22 155 lb (70.3 kg)  05/11/22 161 lb (73 kg)  05/07/22 156 lb (70.8 kg)     GEN: Well nourished, well developed in no acute distress NECK: No JVD; No carotid bruits CARDIAC: ***RRR, no murmurs, rubs, gallops RESPIRATORY:  Clear to auscultation without rales, wheezing or rhonchi  ABDOMEN: Soft, non-tender, non-distended EXTREMITIES:  No edema; No deformity   ASSESSMENT AND PLAN:   ***    {Are you ordering a CV Procedure (e.g. stress test, cath, DCCV, TEE, etc)?   Press F2        :[id number]   Signed, Naveena Eyman, NP

## 2022-07-27 ENCOUNTER — Encounter: Payer: Self-pay | Admitting: Cardiology

## 2022-07-27 ENCOUNTER — Ambulatory Visit: Payer: BC Managed Care – PPO | Attending: Cardiology | Admitting: Cardiology

## 2022-07-27 DIAGNOSIS — E7800 Pure hypercholesterolemia, unspecified: Secondary | ICD-10-CM

## 2022-12-23 ENCOUNTER — Encounter: Payer: Self-pay | Admitting: Internal Medicine

## 2022-12-24 MED ORDER — BUPROPION HCL ER (XL) 150 MG PO TB24: 300.0000 mg | ORAL_TABLET | Freq: Every day | ORAL | 0 refills | Status: DC

## 2023-01-02 ENCOUNTER — Encounter: Payer: Self-pay | Admitting: Internal Medicine

## 2023-01-02 ENCOUNTER — Other Ambulatory Visit: Payer: Self-pay | Admitting: Internal Medicine

## 2023-01-03 MED ORDER — CLONAZEPAM 1 MG PO TABS: 1.0000 mg | ORAL_TABLET | Freq: Every day | ORAL | 0 refills | Status: DC | PRN

## 2023-01-14 ENCOUNTER — Ambulatory Visit: Payer: BC Managed Care – PPO | Admitting: Internal Medicine

## 2023-01-29 ENCOUNTER — Ambulatory Visit: Payer: BC Managed Care – PPO | Admitting: Internal Medicine

## 2023-02-01 ENCOUNTER — Ambulatory Visit: Payer: BC Managed Care – PPO | Admitting: Internal Medicine

## 2023-02-08 ENCOUNTER — Ambulatory Visit (INDEPENDENT_AMBULATORY_CARE_PROVIDER_SITE_OTHER): Payer: BC Managed Care – PPO | Admitting: Internal Medicine

## 2023-02-08 ENCOUNTER — Encounter: Payer: Self-pay | Admitting: Internal Medicine

## 2023-02-08 VITALS — BP 124/80 | HR 66 | Temp 95.3°F | Wt 133.0 lb

## 2023-02-08 DIAGNOSIS — Z1231 Encounter for screening mammogram for malignant neoplasm of breast: Secondary | ICD-10-CM

## 2023-02-08 DIAGNOSIS — K581 Irritable bowel syndrome with constipation: Secondary | ICD-10-CM | POA: Diagnosis not present

## 2023-02-08 DIAGNOSIS — Z1211 Encounter for screening for malignant neoplasm of colon: Secondary | ICD-10-CM | POA: Diagnosis not present

## 2023-02-08 DIAGNOSIS — F419 Anxiety disorder, unspecified: Secondary | ICD-10-CM

## 2023-02-08 DIAGNOSIS — E7800 Pure hypercholesterolemia, unspecified: Secondary | ICD-10-CM | POA: Diagnosis not present

## 2023-02-08 DIAGNOSIS — F32A Depression, unspecified: Secondary | ICD-10-CM

## 2023-02-08 MED ORDER — CLONAZEPAM 1 MG PO TABS: 1.0000 mg | ORAL_TABLET | Freq: Every day | ORAL | 0 refills | Status: DC | PRN

## 2023-02-08 MED ORDER — BUPROPION HCL ER (XL) 150 MG PO TB24: 300.0000 mg | ORAL_TABLET | Freq: Every day | ORAL | 1 refills | Status: DC

## 2023-02-08 NOTE — Assessment & Plan Note (Signed)
Encouraged high-fiber diet and adequate water intake ?Continue MiraLAX as needed ?

## 2023-02-08 NOTE — Assessment & Plan Note (Signed)
C-Met and lipid profile today Encouraged her to consume a low-fat diet Continue atorvastatin 

## 2023-02-08 NOTE — Patient Instructions (Signed)
Health Maintenance for Postmenopausal Women Menopause is a normal process in which your ability to get pregnant comes to an end. This process happens slowly over many months or years, usually between the ages of 48 and 55. Menopause is complete when you have missed your menstrual period for 12 months. It is important to talk with your health care provider about some of the most common conditions that affect women after menopause (postmenopausal women). These include heart disease, cancer, and bone loss (osteoporosis). Adopting a healthy lifestyle and getting preventive care can help to promote your health and wellness. The actions you take can also lower your chances of developing some of these common conditions. What are the signs and symptoms of menopause? During menopause, you may have the following symptoms: Hot flashes. These can be moderate or severe. Night sweats. Decrease in sex drive. Mood swings. Headaches. Tiredness (fatigue). Irritability. Memory problems. Problems falling asleep or staying asleep. Talk with your health care provider about treatment options for your symptoms. Do I need hormone replacement therapy? Hormone replacement therapy is effective in treating symptoms that are caused by menopause, such as hot flashes and night sweats. Hormone replacement carries certain risks, especially as you become older. If you are thinking about using estrogen or estrogen with progestin, discuss the benefits and risks with your health care provider. How can I reduce my risk for heart disease and stroke? The risk of heart disease, heart attack, and stroke increases as you age. One of the causes may be a change in the body's hormones during menopause. This can affect how your body uses dietary fats, triglycerides, and cholesterol. Heart attack and stroke are medical emergencies. There are many things that you can do to help prevent heart disease and stroke. Watch your blood pressure High  blood pressure causes heart disease and increases the risk of stroke. This is more likely to develop in people who have high blood pressure readings or are overweight. Have your blood pressure checked: Every 3-5 years if you are 18-39 years of age. Every year if you are 40 years old or older. Eat a healthy diet  Eat a diet that includes plenty of vegetables, fruits, low-fat dairy products, and lean protein. Do not eat a lot of foods that are high in solid fats, added sugars, or sodium. Get regular exercise Get regular exercise. This is one of the most important things you can do for your health. Most adults should: Try to exercise for at least 150 minutes each week. The exercise should increase your heart rate and make you sweat (moderate-intensity exercise). Try to do strengthening exercises at least twice each week. Do these in addition to the moderate-intensity exercise. Spend less time sitting. Even light physical activity can be beneficial. Other tips Work with your health care provider to achieve or maintain a healthy weight. Do not use any products that contain nicotine or tobacco. These products include cigarettes, chewing tobacco, and vaping devices, such as e-cigarettes. If you need help quitting, ask your health care provider. Know your numbers. Ask your health care provider to check your cholesterol and your blood sugar (glucose). Continue to have your blood tested as directed by your health care provider. Do I need screening for cancer? Depending on your health history and family history, you may need to have cancer screenings at different stages of your life. This may include screening for: Breast cancer. Cervical cancer. Lung cancer. Colorectal cancer. What is my risk for osteoporosis? After menopause, you may be   at increased risk for osteoporosis. Osteoporosis is a condition in which bone destruction happens more quickly than new bone creation. To help prevent osteoporosis or  the bone fractures that can happen because of osteoporosis, you may take the following actions: If you are 19-50 years old, get at least 1,000 mg of calcium and at least 600 international units (IU) of vitamin D per day. If you are older than age 50 but younger than age 70, get at least 1,200 mg of calcium and at least 600 international units (IU) of vitamin D per day. If you are older than age 70, get at least 1,200 mg of calcium and at least 800 international units (IU) of vitamin D per day. Smoking and drinking excessive alcohol increase the risk of osteoporosis. Eat foods that are rich in calcium and vitamin D, and do weight-bearing exercises several times each week as directed by your health care provider. How does menopause affect my mental health? Depression may occur at any age, but it is more common as you become older. Common symptoms of depression include: Feeling depressed. Changes in sleep patterns. Changes in appetite or eating patterns. Feeling an overall lack of motivation or enjoyment of activities that you previously enjoyed. Frequent crying spells. Talk with your health care provider if you think that you are experiencing any of these symptoms. General instructions See your health care provider for regular wellness exams and vaccines. This may include: Scheduling regular health, dental, and eye exams. Getting and maintaining your vaccines. These include: Influenza vaccine. Get this vaccine each year before the flu season begins. Pneumonia vaccine. Shingles vaccine. Tetanus, diphtheria, and pertussis (Tdap) booster vaccine. Your health care provider may also recommend other immunizations. Tell your health care provider if you have ever been abused or do not feel safe at home. Summary Menopause is a normal process in which your ability to get pregnant comes to an end. This condition causes hot flashes, night sweats, decreased interest in sex, mood swings, headaches, or lack  of sleep. Treatment for this condition may include hormone replacement therapy. Take actions to keep yourself healthy, including exercising regularly, eating a healthy diet, watching your weight, and checking your blood pressure and blood sugar levels. Get screened for cancer and depression. Make sure that you are up to date with all your vaccines. This information is not intended to replace advice given to you by your health care provider. Make sure you discuss any questions you have with your health care provider. Document Revised: 09/05/2020 Document Reviewed: 09/05/2020 Elsevier Patient Education  2024 [employer]  

## 2023-02-08 NOTE — Assessment & Plan Note (Signed)
Deteriorated due to increased stress She would like to continue her current dose of escitalopram bupropion and clonazepam, refilled today She will continue to meet with her therapist Support offered

## 2023-02-08 NOTE — Progress Notes (Signed)
Subjective:    Patient ID: Amanda Parrish, female    DOB: January 15, 1966, 57 y.o.   MRN: [id number]  HPI  Patient presents to clinic today for follow-up of chronic conditions.  IBS: Mainly constipation.  She consumes a high-fiber diet and takes miraLAX as needed with good results.  Colonoscopy from 08/2015 reviewed.  HLD: Her last LDL was 158, triglycerides 87, 04/2022.  She is taking atorvastatin. She was prescribed repatha but admits she did not pick this up.  She tries to consume a low-fat diet.  Anxiety and depression: Chronic, managed on escitalopram, bupropion and clonazepam.  She has been under a lot of stress lately with work, caring for her mother with dementia and helping out with her grandchildren.  She is currently seeing a therapist.  She denies SI/HI.   Review of Systems     Past Medical History:  Diagnosis Date   Allergy    Anemia    Anxiety    Arthritis    hands and feet and hips    Depression    GERD (gastroesophageal reflux disease)    Hyperlipidemia    IBS (irritable bowel syndrome)    Status post dilation of esophageal narrowing     Current Outpatient Medications  Medication Sig Dispense Refill   atorvastatin (LIPITOR) 80 MG tablet Take 1 tablet (80 mg total) by mouth daily. Take 1 tablet (40 mg total) by mouth daily at 6 PM. (Patient taking differently: Take 80 mg by mouth daily.) 30 tablet 3   buPROPion (WELLBUTRIN XL) 150 MG 24 hr tablet Take 2 tablets (300 mg total) by mouth daily. Please schedule an office visit before anymore refills. 60 tablet 0   clonazePAM (KLONOPIN) 1 MG tablet Take 1 tablet (1 mg total) by mouth daily as needed for anxiety. 30 tablet 0   escitalopram (LEXAPRO) 10 MG tablet Take 10 mg by mouth daily.     Evolocumab (REPATHA SURECLICK) 140 MG/ML SOAJ Inject 140 mg into the skin every 14 (fourteen) days. 2 mL 6   Magnesium 250 MG TABS Take 250 mg by mouth daily.     meloxicam (MOBIC) 15 MG tablet Take 1 tablet (15 mg total) by  mouth daily. 30 tablet 0   VITAMIN D PO Take 10,000 Units by mouth every other day.      No current facility-administered medications for this visit.    No Known Allergies  Family History  Problem Relation Age of Onset   Colon polyps Mother    Heart disease Father    Colon cancer Maternal Aunt    Breast cancer Paternal Aunt 62   Breast cancer Paternal Grandmother 29   Prostate cancer Paternal Grandfather    Rectal cancer Neg Hx    Stomach cancer Neg Hx    Esophageal cancer Neg Hx     Social History   Socioeconomic History   Marital status: Married    Spouse name: Not on file   Number of children: Not on file   Years of education: Not on file   Highest education level: Not on file  Occupational History   Not on file  Tobacco Use   Smoking status: Former    Current packs/day: 0.50    Types: Cigarettes   Smokeless tobacco: Never  Vaping Use   Vaping status: Never Used  Substance and Sexual Activity   Alcohol use: Yes    Comment: rarely   Drug use: No   Sexual activity: Yes    Birth  control/protection: Surgical    Comment: BTL-1st intercourse 57 yo-More than 5 partners  Other Topics Concern   Not on file  Social History Narrative   Patient is married   She has 1 son and 1 daughter   She is a Solicitor of court-deputy clerk  in [employer]      Former smoker 2 alcoholic beverages a month 1 or 2 caffeinated beverages daily   Social Determinants of Corporate investment banker Strain: Not on file  Food Insecurity: Not on file  Transportation Needs: Not on file  Physical Activity: Not on file  Stress: Not on file  Social Connections: Not on file  Intimate Partner Violence: Not on file     Constitutional: Denies fever, malaise, fatigue, headache or abrupt weight changes.  HEENT: Denies eye pain, eye redness, ear pain, ringing in the ears, wax buildup, runny nose, nasal congestion, bloody nose, or sore throat. Respiratory: Denies difficulty breathing,  shortness of breath, cough or sputum production.   Cardiovascular: Denies chest pain, chest tightness, palpitations or swelling in the hands or feet.  Gastrointestinal: Patient reports constipation.  Denies abdominal pain, bloating, diarrhea or blood in the stool.  GU: Denies urgency, frequency, pain with urination, burning sensation, blood in urine, odor or discharge. Musculoskeletal: Denies decrease in range of motion, difficulty with gait, muscle pain or joint pain and swelling.  Skin: Denies redness, rashes, lesions or ulcercations.  Neurological: Denies dizziness, difficulty with memory, difficulty with speech or problems with balance and coordination.  Psych: Patient has a history of anxiety and depression.  Denies SI/HI.  No other specific complaints in a complete review of systems (except as listed in HPI above).  Objective:   Physical Exam   BP 124/80 (BP Location: Left Arm, Patient Position: Sitting, Cuff Size: Normal)   Pulse 66   Temp (!) 95.3 F (35.2 C) (Temporal)   Wt 133 lb (60.3 kg)   SpO2 98%   BMI 21.47 kg/m   Wt Readings from Last 3 Encounters:  05/18/22 155 lb (70.3 kg)  05/11/22 161 lb (73 kg)  05/07/22 156 lb (70.8 kg)    General: Appears her stated age, well developed, well nourished in NAD. Skin: Warm, dry and intact.  HEENT: Head: normal shape and size; Eyes: sclera white, no icterus, conjunctiva pink, PERRLA and EOMs intact;  Neck:  Neck supple, trachea midline. No masses, lumps or thyromegaly present.  Cardiovascular: Normal rate and rhythm. S1,S2 noted.  No murmur, rubs or gallops noted. No JVD or BLE edema. No carotid bruits noted. Pulmonary/Chest: Normal effort and positive vesicular breath sounds. No respiratory distress. No wheezes, rales or ronchi noted.  Musculoskeletal: No difficulty with gait.  Neurological: Alert and oriented. Coordination normal.  Psychiatric: Mood and affect flat.  Tearful. Judgment and thought content normal.   BMET     Component Value Date/Time   NA 141 05/07/2022 0847   K 4.1 05/07/2022 0847   CL 105 05/07/2022 0847   CO2 27 05/07/2022 0847   GLUCOSE 92 05/07/2022 0847   BUN 19 05/07/2022 0847   CREATININE 0.94 05/07/2022 0847   CALCIUM 9.8 05/07/2022 0847   GFRNONAA >60 03/19/2022 1030    Lipid Panel     Component Value Date/Time   CHOL 224 (H) 05/07/2022 0847   TRIG 87 05/07/2022 0847   HDL 47 (L) 05/07/2022 0847   CHOLHDL 4.8 05/07/2022 0847   VLDL 12.4 05/25/2019 1137   LDLCALC 158 (H) 05/07/2022 0847  CBC    Component Value Date/Time   WBC 5.0 05/07/2022 0847   RBC 4.59 05/07/2022 0847   HGB 14.5 05/07/2022 0847   HCT 42.5 05/07/2022 0847   HCT 41 10/16/2017 0000   PLT 228 05/07/2022 0847   MCV 92.6 05/07/2022 0847   MCH 31.6 05/07/2022 0847   MCHC 34.1 05/07/2022 0847   RDW 12.2 05/07/2022 0847   LYMPHSABS 1,666 01/26/2022 1044   MONOABS 0.4 06/21/2015 0831   EOSABS 140 01/26/2022 1044   BASOSABS 49 01/26/2022 1044    Hgb A1C Lab Results  Component Value Date   HGBA1C 4.9 01/12/2021           Assessment & Plan:     RTC in 6 months for annual exam Nicki Reaper, NP

## 2023-02-09 LAB — COMPLETE METABOLIC PANEL WITH GFR
AG Ratio: 1.6 (calc) (ref 1.0–2.5)
ALT: 14 U/L (ref 6–29)
AST: 17 U/L (ref 10–35)
Albumin: 4.8 g/dL (ref 3.6–5.1)
Alkaline phosphatase (APISO): 98 U/L (ref 37–153)
BUN: 15 mg/dL (ref 7–25)
CO2: 28 mmol/L (ref 20–32)
Calcium: 10 mg/dL (ref 8.6–10.4)
Chloride: 102 mmol/L (ref 98–110)
Creat: 0.82 mg/dL (ref 0.50–1.03)
Globulin: 3 g/dL (ref 1.9–3.7)
Glucose, Bld: 88 mg/dL (ref 65–99)
Potassium: 4.4 mmol/L (ref 3.5–5.3)
Sodium: 141 mmol/L (ref 135–146)
Total Bilirubin: 0.6 mg/dL (ref 0.2–1.2)
Total Protein: 7.8 g/dL (ref 6.1–8.1)
eGFR: 84 mL/min/{1.73_m2} (ref >=60)

## 2023-02-09 LAB — LIPID PANEL
Cholesterol: 383 mg/dL — ABNORMAL HIGH (ref <200)
HDL: 75 mg/dL (ref >=50)
LDL Cholesterol (Calc): 287 mg/dL — ABNORMAL HIGH
Non-HDL Cholesterol (Calc): 308 mg/dL — ABNORMAL HIGH (ref <130)
Total CHOL/HDL Ratio: 5.1 (calc) — ABNORMAL HIGH (ref <5.0)
Triglycerides: 90 mg/dL (ref <150)

## 2023-02-12 ENCOUNTER — Encounter: Payer: Self-pay | Admitting: Internal Medicine

## 2023-02-12 NOTE — Progress Notes (Signed)
PCP has recommended reinitiation of statin therapy.

## 2023-02-13 ENCOUNTER — Ambulatory Visit: Payer: BC Managed Care – PPO | Admitting: Internal Medicine

## 2023-02-18 ENCOUNTER — Encounter: Payer: Self-pay | Admitting: *Deleted

## 2023-04-29 ENCOUNTER — Ambulatory Visit: Payer: Self-pay

## 2023-04-29 NOTE — Telephone Encounter (Signed)
  Chief Complaint: sinus infection  Symptoms: cough with congestion chest tightness and discomfort in back with breathing  Frequency: last Friday  Pertinent Negatives: Patient denies fever or SOB  Disposition: [] ED /[x] Urgent Care (no appt availability in office) / [] Appointment(In office/virtual)/ []  [facility]/ [] Home Care/ [] Refused Recommended Disposition /[] [facility]/ []  Follow-up with PCP Additional Notes: pt asking for CXR to make sure nothing is going on in chest but feels tightness and discomfort. Says that she has been taking OTC meds but not really helping, was sick a week or 2 ago and got better but sx back. No appts until 05/01/22, advised pt UC, scheduled UC appt tomorrow at 0945. Advised if SOB or fever develops to go to ED.   Reason for Disposition  [1] SEVERE pain AND [2] not improved 2 hours after pain medicine  Answer Assessment - Initial Assessment Questions 1. LOCATION: "Where does it hurt?"      Back and chest  2. ONSET: "When did the sinus pain start?"  (e.g., hours, days)      Friday  3. SEVERITY: "How bad is the pain?"   (Scale 1-10; mild, moderate or severe)   - MILD (1-3): doesn't interfere with normal activities    - MODERATE (4-7): interferes with normal activities (e.g., work or school) or awakens from sleep   - SEVERE (8-10): excruciating pain and patient unable to do any normal activities        6/10 4. RECURRENT SYMPTOM: "Have you ever had sinus problems before?" If Yes, ask: "When was the last time?" and "What happened that time?"      Had few weeks ago but now back  7. FEVER: "Do you have a fever?" If Yes, ask: "What is it, how was it measured, and when did it start?"      no 8. OTHER SYMPTOMS: "Do you have any other symptoms?" (e.g., sore throat, cough, earache, difficulty breathing)     Cough with congestion, chest tightness and discomfort in back with breathing  Protocols used: Sinus Pain or Congestion-A-AH

## 2023-04-30 ENCOUNTER — Telehealth: Payer: Self-pay | Admitting: Emergency Medicine

## 2023-04-30 ENCOUNTER — Ambulatory Visit (INDEPENDENT_AMBULATORY_CARE_PROVIDER_SITE_OTHER): Payer: BC Managed Care – PPO

## 2023-04-30 ENCOUNTER — Ambulatory Visit
Admission: RE | Admit: 2023-04-30 | Discharge: 2023-04-30 | Disposition: A | Discharge status: Home / Self Care | Payer: BC Managed Care – PPO | Source: Ambulatory Visit

## 2023-04-30 VITALS — BP 111/75 | HR 75 | Temp 99.2°F | Resp 18

## 2023-04-30 DIAGNOSIS — R0789 Other chest pain: Secondary | ICD-10-CM | POA: Diagnosis not present

## 2023-04-30 DIAGNOSIS — J069 Acute upper respiratory infection, unspecified: Secondary | ICD-10-CM

## 2023-04-30 MED ORDER — PREDNISONE 10 MG (21) PO TBPK: ORAL_TABLET | Freq: Every day | ORAL | 0 refills | Status: DC

## 2023-04-30 MED ORDER — AMOXICILLIN-POT CLAVULANATE 875-125 MG PO TABS: 1.0000 | ORAL_TABLET | Freq: Two times a day (BID) | ORAL | 0 refills | Status: DC

## 2023-04-30 NOTE — Discharge Instructions (Addendum)
 Exam able to hear congestion in your left lobe and therefore chest x-ray has been completed, you will be notified of results via telephone  Based on your exam begin Augmentin  every morning and every evening for 7 days to provide coverage for any bacteria causing symptoms to prolong  Begin prednisone  every morning with food to open and relax the airway, should reduce chest heaviness, shortness of breath and wheezing    You can take Tylenol and/or Ibuprofen as needed for fever reduction and pain relief.   For cough: honey 1/2 to 1 teaspoon (you can dilute the honey in water or another fluid).  You can also use guaifenesin and dextromethorphan for cough. You can use a humidifier for chest congestion and cough.  If you don't have a humidifier, you can sit in the bathroom with the hot shower running.      For sore throat: try warm salt water gargles, cepacol lozenges, throat spray, warm tea or water with lemon/honey, popsicles or ice, or OTC cold relief medicine for throat discomfort.   For congestion: take a daily anti-histamine like Zyrtec, Claritin, and a oral decongestant, such as pseudoephedrine.  You can also use Flonase 1-2 sprays in each nostril daily.   It is important to stay hydrated: drink plenty of fluids (water, gatorade/powerade/pedialyte, juices, or teas) to keep your throat moisturized and help further relieve irritation/discomfort.

## 2023-04-30 NOTE — ED Provider Notes (Signed)
 Amanda Parrish    CSN: [id number] Arrival date & time: 04/30/23  9056      History   Chief Complaint Chief Complaint  Patient presents with   Cough    chest discomfort and congestion - Entered by patient    HPI Amanda Parrish is a 57 y.o. female.   Patient presents for evaluation of centralized chest heaviness and centralized back pain present for 1 month beginning after upper respiratory infection.  Over the past 4 days has begun to experience fever peaking at 99, nasal congestion, raspy cough with shortness of breath and wheezing and intermittent generalized headaches.  Recent exposure to influenza and RSV.  Was evaluated on December 12 during prior respiratory illness, COVID flu and strep testing negative at that time, was recommended supportive care.  Has been able to tolerate food and liquids but endorses a foul taste.  Has attempted use of homeopathic over-the-counter cough and congestion medicines.  Past Medical History:  Diagnosis Date   Allergy    Anemia    Anxiety    Arthritis    hands and feet and hips    Depression    GERD (gastroesophageal reflux disease)    Hyperlipidemia    IBS (irritable bowel syndrome)    Status post dilation of esophageal narrowing     Patient Active Problem List   Diagnosis Date Noted   IBS (irritable bowel syndrome) 05/20/2017   HLD (hyperlipidemia) 09/28/2014   Anxiety and depression 09/28/2014    Past Surgical History:  Procedure Laterality Date   COLONOSCOPY     ENDOMETRIAL ABLATION  2007   Novsure   POLYPECTOMY     TA 2008   TOOTH EXTRACTION     TUBAL LIGATION     UPPER GASTROINTESTINAL ENDOSCOPY     stricture and had dilatation   WISDOM TOOTH EXTRACTION      OB History     Gravida  3   Para  3   Term  2   Preterm  1   AB      Living  2      SAB      IAB      Ectopic      Multiple      Live Births               Home Medications    Prior to Admission medications    Medication Sig Start Date End Date Taking? Authorizing Provider  amoxicillin -clavulanate (AUGMENTIN ) 875-125 MG tablet Take 1 tablet by mouth every 12 (twelve) hours. 04/30/23  Yes Renita Brocks R, NP  levofloxacin (LEVAQUIN) 250 MG tablet Take 250 mg by mouth daily. Patient not taking: Reported on 04/30/2023 04/17/23   [provider]  predniSONE  (STERAPRED UNI-PAK 21 TAB) 10 MG (21) TBPK tablet Take by mouth daily. Take 6 tabs by mouth daily  for 1 days, then 5 tabs for 1 days, then 4 tabs for 1 days, then 3 tabs for 1 days, 2 tabs for 1 days, then 1 tab by mouth daily for 1 days 04/30/23  Yes Joshuah Minella R, NP  atorvastatin  (LIPITOR) 80 MG tablet Take 1 tablet (80 mg total) by mouth daily. Take 1 tablet (40 mg total) by mouth daily at 6 PM. Patient taking differently: Take 80 mg by mouth daily. 03/19/22   Darliss Rogue, MD  buPROPion  (WELLBUTRIN  XL) 150 MG 24 hr tablet Take 2 tablets (300 mg total) by mouth daily. 02/08/23   Antonette Angeline ORN,  NP  clonazePAM  (KLONOPIN ) 1 MG tablet Take 1 tablet (1 mg total) by mouth daily as needed for anxiety. 02/08/23   Antonette Angeline ORN, NP  escitalopram  (LEXAPRO ) 10 MG tablet Take 10 mg by mouth daily. Patient not taking: Reported on 04/30/2023    [provider]  Magnesium 250 MG TABS Take 250 mg by mouth daily.    [provider]  meloxicam  (MOBIC ) 15 MG tablet Take 1 tablet (15 mg total) by mouth daily. 05/18/22   Mecum, Erin E, PA-C  VITAMIN D  PO Take 10,000 Units by mouth every other day.     [provider]    Family History Family History  Problem Relation Age of Onset   Colon polyps Mother    Heart disease Father    Colon cancer Maternal Aunt    Breast cancer Paternal Aunt 58   Breast cancer Paternal Grandmother 39   Prostate cancer Paternal Grandfather    Rectal cancer Neg Hx    Stomach cancer Neg Hx    Esophageal cancer Neg Hx     Social History Social History   Tobacco Use   Smoking  status: Former    Current packs/day: 0.50    Types: Cigarettes   Smokeless tobacco: Never  Vaping Use   Vaping status: Never Used  Substance Use Topics   Alcohol use: Yes    Comment: rarely   Drug use: No     Allergies   Patient has no known allergies.   Review of Systems Review of Systems   Physical Exam Triage Vital Signs ED Triage Vitals  Encounter Vitals Group     BP 04/30/23 0952 111/75     Systolic BP Percentile --      Diastolic BP Percentile --      Pulse Rate 04/30/23 0952 75     Resp 04/30/23 0952 18     Temp 04/30/23 0952 99.2 F (37.3 C)     Temp src --      SpO2 04/30/23 0952 98 %     Weight --      Height --      Head Circumference --      Peak Flow --      Pain Score 04/30/23 0946 6     Pain Loc --      Pain Education --      Exclude from Growth Chart --    No data found.  Updated Vital Signs BP 111/75   Pulse 75   Temp 99.2 F (37.3 C)   Resp 18   SpO2 98%   Visual Acuity Right Eye Distance:   Left Eye Distance:   Bilateral Distance:    Right Eye Near:   Left Eye Near:    Bilateral Near:     Physical Exam Constitutional:      Appearance: She is ill-appearing.  HENT:     Head: Normocephalic.     Right Ear: Tympanic membrane, ear canal and external ear normal.     Left Ear: Tympanic membrane, ear canal and external ear normal.     Nose: Congestion present. No rhinorrhea.     Mouth/Throat:     Mouth: Mucous membranes are moist.     Pharynx: Oropharynx is clear.  Eyes:     Extraocular Movements: Extraocular movements intact.  Cardiovascular:     Rate and Rhythm: Normal rate and regular rhythm.     Pulses: Normal pulses.     Heart sounds: Normal heart sounds.  Pulmonary:     Effort: Pulmonary effort is normal.     Comments: Rhonchi present to the left upper lobe, remaining lobes clear Musculoskeletal:     Cervical back: Normal range of motion and neck supple.  Neurological:     Mental Status: She is alert and oriented to  person, place, and time. Mental status is at baseline.      UC Treatments / Results  Labs (all labs ordered are listed, but only abnormal results are displayed) Labs Reviewed - No data to display  EKG   Radiology No results found.  Procedures Procedures (including critical care time)  Medications Ordered in UC Medications - No data to display  Initial Impression / Assessment and Plan / UC Course  I have reviewed the triage vital signs and the nursing notes.  Pertinent labs & imaging results that were available during my care of the patient were reviewed by me and considered in my medical decision making (see chart for details).  Clinical Course as of 04/30/23 1004  Tue Apr 30, 2023  1004 DG Chest 2 View [AW]    Clinical Course User Index [AW] Teresa Price R, NP    Acute URI, chest heaviness  Vitals are stable, while ill-appearing patient is in no signs of distress nontoxic-appearing, rhonchi heard to the left upper lobe, no improvement with forceful coughing, chest x-ray pending, will report results via telephone, prescribed Augmentin  and prednisone , recommended additional supportive measures with follow-up with urgent care as needed Final Clinical Impressions(s) / UC Diagnoses   Final diagnoses:  Chest heaviness  Acute URI     Discharge Instructions      Exam able to hear congestion in your left lobe and therefore chest x-ray has been completed, you will be notified of results via telephone  Based on your exam begin Augmentin  every morning and every evening for 7 days to provide coverage for any bacteria causing symptoms to prolong  Begin prednisone  every morning with food to open and relax the airway, should reduce chest heaviness, shortness of breath and wheezing    You can take Tylenol and/or Ibuprofen as needed for fever reduction and pain relief.   For cough: honey 1/2 to 1 teaspoon (you can dilute the honey in water or another fluid).  You can also  use guaifenesin and dextromethorphan for cough. You can use a humidifier for chest congestion and cough.  If you don't have a humidifier, you can sit in the bathroom with the hot shower running.      For sore throat: try warm salt water gargles, cepacol lozenges, throat spray, warm tea or water with lemon/honey, popsicles or ice, or OTC cold relief medicine for throat discomfort.   For congestion: take a daily anti-histamine like Zyrtec, Claritin, and a oral decongestant, such as pseudoephedrine.  You can also use Flonase 1-2 sprays in each nostril daily.   It is important to stay hydrated: drink plenty of fluids (water, gatorade/powerade/pedialyte, juices, or teas) to keep your throat moisturized and help further relieve irritation/discomfort.    ED Prescriptions     Medication Sig Dispense Auth. Provider   amoxicillin -clavulanate (AUGMENTIN ) 875-125 MG tablet Take 1 tablet by mouth every 12 (twelve) hours. 14 tablet Renay Crammer R, NP   predniSONE  (STERAPRED UNI-PAK 21 TAB) 10 MG (21) TBPK tablet Take by mouth daily. Take 6 tabs by mouth daily  for 1 days, then 5 tabs for 1 days, then 4 tabs for 1 days, then 3 tabs for 1  days, 2 tabs for 1 days, then 1 tab by mouth daily for 1 days 21 tablet Jamauri Kruzel, Shelba SAUNDERS, NP      PDMP not reviewed this encounter.   Teresa Shelba SAUNDERS, NP 04/30/23 1007

## 2023-04-30 NOTE — ED Triage Notes (Signed)
 Patient to Urgent Care with complaints of chest congestion/ raspy cough. Denies any known fevers- max temp 99.  Reports symptoms started Friday- worsened Sunday. States she has some heaviness in her back and central chest that started at the beginning of December.  Meds: cough medicine/ Boiron Oscillococcinum Homeopathic Medicine.

## 2023-04-30 NOTE — Telephone Encounter (Signed)
 Reported chest x-ray results to patient via telephone, 2 patient identifiers used, recommended continue treatment plan as discussed

## 2023-08-05 ENCOUNTER — Other Ambulatory Visit: Payer: Self-pay | Admitting: Internal Medicine

## 2023-08-05 MED ORDER — CLONAZEPAM 1 MG PO TABS: 1.0000 mg | ORAL_TABLET | Freq: Every day | ORAL | 0 refills | Status: DC | PRN

## 2023-08-06 NOTE — Telephone Encounter (Signed)
 Courtesy refill. Patient must keep upcoming appointment for additional refills. Requested Prescriptions  Pending Prescriptions Disp Refills   buPROPion (WELLBUTRIN XL) 150 MG 24 hr tablet [Pharmacy Med Name: BUPROPION HCL XL 150 MG TABLET] 60 tablet 0    Sig: Take 2 tablets (300 mg total) by mouth daily.     Psychiatry: Antidepressants - bupropion Failed - 08/06/2023  2:56 PM      Failed - Valid encounter within last 6 months    Recent Outpatient Visits   None            Passed - Cr in normal range and within 360 days    Creat  Date Value Ref Range Status  02/08/2023 0.82 0.50 - 1.03 mg/dL Final         Passed - AST in normal range and within 360 days    AST  Date Value Ref Range Status  02/08/2023 17 10 - 35 U/L Final         Passed - ALT in normal range and within 360 days    ALT  Date Value Ref Range Status  02/08/2023 14 6 - 29 U/L Final         Passed - Completed PHQ-2 or PHQ-9 in the last 360 days      Passed - Last BP in normal range    BP Readings from Last 1 Encounters:  04/30/23 111/75

## 2023-08-19 ENCOUNTER — Encounter: Payer: Self-pay | Admitting: Internal Medicine

## 2023-08-19 ENCOUNTER — Ambulatory Visit (INDEPENDENT_AMBULATORY_CARE_PROVIDER_SITE_OTHER): Payer: Self-pay | Admitting: Internal Medicine

## 2023-08-19 VITALS — BP 108/68 | Ht 66.0 in | Wt 126.2 lb

## 2023-08-19 DIAGNOSIS — R350 Frequency of micturition: Secondary | ICD-10-CM

## 2023-08-19 DIAGNOSIS — R739 Hyperglycemia, unspecified: Secondary | ICD-10-CM | POA: Diagnosis not present

## 2023-08-19 DIAGNOSIS — E7800 Pure hypercholesterolemia, unspecified: Secondary | ICD-10-CM | POA: Diagnosis not present

## 2023-08-19 DIAGNOSIS — Z0001 Encounter for general adult medical examination with abnormal findings: Secondary | ICD-10-CM | POA: Diagnosis not present

## 2023-08-19 LAB — POCT URINE DIPSTICK
Bilirubin, UA: NEGATIVE
Blood, UA: NEGATIVE
Glucose, UA: NEGATIVE mg/dL
Ketones, POC UA: NEGATIVE mg/dL
Leukocytes, UA: NEGATIVE
Nitrite, UA: NEGATIVE
POC PROTEIN,UA: NEGATIVE
Spec Grav, UA: 1.015 (ref 1.010–1.025)
Urobilinogen, UA: 0.2 U/dL
pH, UA: 6.5 (ref 5.0–8.0)

## 2023-08-19 MED ORDER — BUPROPION HCL ER (XL) 300 MG PO TB24: 300.0000 mg | ORAL_TABLET | Freq: Every day | ORAL | 1 refills | Status: DC

## 2023-08-19 NOTE — Progress Notes (Addendum)
 Subjective:    Patient ID: Amanda Parrish, female    DOB: [birth year], 58 y.o.   MRN: [id number]  HPI  Patient presents to clinic today for her annual exam.  Flu: 04/2022 Tetanus: 05/2019 COVID: Pfizer x 2 Shingrix: Never Pap smear: 11/2018 Mammogram: 05/2018 Colon screening: 08/2015 Vision screening: annually Dentist: biannually  Diet: She does eat lean meat. She consumes fruits and veggies. She tries to avoid fried foods. She drinks mostly water and coffee. Exercise: None  Review of Systems     Past Medical History:  Diagnosis Date   Allergy    Anemia    Anxiety    Arthritis    hands and feet and hips    Depression    GERD (gastroesophageal reflux disease)    Hyperlipidemia    IBS (irritable bowel syndrome)    Status post dilation of esophageal narrowing     Current Outpatient Medications  Medication Sig Dispense Refill   amoxicillin -clavulanate (AUGMENTIN ) 875-125 MG tablet Take 1 tablet by mouth every 12 (twelve) hours. 14 tablet 0   atorvastatin  (LIPITOR) 80 MG tablet Take 1 tablet (80 mg total) by mouth daily. Take 1 tablet (40 mg total) by mouth daily at 6 PM. (Patient taking differently: Take 80 mg by mouth daily.) 30 tablet 3   buPROPion  (WELLBUTRIN  XL) 150 MG 24 hr tablet Take 2 tablets (300 mg total) by mouth daily. 60 tablet 0   clonazePAM  (KLONOPIN ) 1 MG tablet Take 1 tablet (1 mg total) by mouth daily as needed for anxiety. 30 tablet 0   escitalopram  (LEXAPRO ) 10 MG tablet Take 10 mg by mouth daily. (Patient not taking: Reported on 04/30/2023)     levofloxacin (LEVAQUIN) 250 MG tablet Take 250 mg by mouth daily. (Patient not taking: Reported on 04/30/2023)     Magnesium 250 MG TABS Take 250 mg by mouth daily.     meloxicam  (MOBIC ) 15 MG tablet Take 1 tablet (15 mg total) by mouth daily. 30 tablet 0   predniSONE  (STERAPRED UNI-PAK 21 TAB) 10 MG (21) TBPK tablet Take by mouth daily. Take 6 tabs by mouth daily  for 1 days, then 5 tabs for 1 days, then 4  tabs for 1 days, then 3 tabs for 1 days, 2 tabs for 1 days, then 1 tab by mouth daily for 1 days 21 tablet 0   VITAMIN D  PO Take 10,000 Units by mouth every other day.      No current facility-administered medications for this visit.    No Known Allergies  Family History  Problem Relation Age of Onset   Colon polyps Mother    Heart disease Father    Colon cancer Maternal Aunt    Breast cancer Paternal Aunt 85   Breast cancer Paternal Grandmother 50   Prostate cancer Paternal Grandfather    Rectal cancer Neg Hx    Stomach cancer Neg Hx    Esophageal cancer Neg Hx     Social History   Socioeconomic History   Marital status: Married    Spouse name: Not on file   Number of children: Not on file   Years of education: Not on file   Highest education level: 12th grade  Occupational History   Not on file  Tobacco Use   Smoking status: Former    Current packs/day: 0.50    Types: Cigarettes   Smokeless tobacco: Never  Vaping Use   Vaping status: Never Used  Substance and Sexual Activity   Alcohol  use: Yes    Comment: rarely   Drug use: No   Sexual activity: Yes    Birth control/protection: Surgical    Comment: BTL-1st intercourse 58 yo-More than 5 partners  Other Topics Concern   Not on file  Social History Narrative   Patient is married   She has 1 son and 1 daughter   She is a Solicitor of court-deputy clerk  in [place]      Former smoker 2 alcoholic beverages a month 1 or 2 caffeinated beverages daily   Social Drivers of Corporate investment banker Strain: Low Risk  (08/14/2023)   Overall Financial Resource Strain (CARDIA)    Difficulty of Paying Living Expenses: Not very hard  Food Insecurity: No Food Insecurity (08/14/2023)   Hunger Vital Sign    Worried About Running Out of Food in the Last Year: Never true    Ran Out of Food in the Last Year: Never true  Transportation Needs: No Transportation Needs (08/14/2023)   PRAPARE - Scientist, research (physical sciences) (Medical): No    Lack of Transportation (Non-Medical): No  Physical Activity: Insufficiently Active (08/14/2023)   Exercise Vital Sign    Days of Exercise per Week: 3 days    Minutes of Exercise per Session: 10 min  Stress: Stress Concern Present (08/14/2023)   [employer] of Occupational Health - Occupational Stress Questionnaire    Feeling of Stress : Very much  Social Connections: Moderately Integrated (08/14/2023)   Social Connection and Isolation Panel [NHANES]    Frequency of Communication with Friends and Family: Once a week    Frequency of Social Gatherings with Friends and Family: Three times a week    Attends Religious Services: More than 4 times per year    Active Member of Clubs or Organizations: No    Attends Engineer, structural: Not on file    Marital Status: Married  Catering manager Violence: Not on file     Constitutional: Denies fever, malaise, fatigue, headache or abrupt weight changes.  HEENT: Denies eye pain, eye redness, ear pain, ringing in the ears, wax buildup, runny nose, nasal congestion, bloody nose, or sore throat. Respiratory: Denies difficulty breathing, shortness of breath, cough or sputum production.   Cardiovascular: Denies chest pain, chest tightness, palpitations or swelling in the hands or feet.  Gastrointestinal: Pt reports intermittent constipation. Denies abdominal pain, bloating, diarrhea or blood in the stool.  GU: Pt reports urinary frequency. Denies urgency, frequency, pain with urination, burning sensation, blood in urine, odor or discharge. Musculoskeletal: Denies decrease in range of motion, difficulty with gait, muscle pain or joint pain and swelling.  Skin: Denies redness, rashes, lesions or ulcercations.  Neurological: Denies dizziness, difficulty with memory, difficulty with speech or problems with balance and coordination.  Psych: Patient has a history of anxiety and depression.  Denies SI/HI.  No other  specific complaints in a complete review of systems (except as listed in HPI above).  Objective:   Physical Exam  BP 108/68 (BP Location: Left Arm, Patient Position: Sitting, Cuff Size: Normal)   Ht 5\' 6"  (1.676 m)   Wt 126 lb 3.2 oz (57.2 kg)   BMI 20.37 kg/m    Wt Readings from Last 3 Encounters:  02/08/23 133 lb (60.3 kg)  05/18/22 155 lb (70.3 kg)  05/11/22 161 lb (73 kg)    General: Appears her stated age, in NAD. Skin: Warm, dry and intact.  HEENT: Head: normal shape and  size; Eyes: sclera white, no icterus, conjunctiva pink, PERRLA and EOMs intact;  Neck:  Neck supple, trachea midline. No masses, lumps or thyromegaly present.  Cardiovascular: Normal rate and rhythm. S1,S2 noted.  No murmur, rubs or gallops noted. No JVD or BLE edema. No carotid bruits noted. Pulmonary/Chest: Normal effort and positive vesicular breath sounds. No respiratory distress. No wheezes, rales or ronchi noted.  Abdomen: Normal bowel sounds. Musculoskeletal: Strength 5/5 BUE/BLE.  No difficulty with gait.  Neurological: Alert and oriented. Cranial nerves II-XII grossly intact. Coordination normal.  Psychiatric: Mood and affect flat. Tearful. Judgment and thought content normal.     BMET    Component Value Date/Time   NA 141 02/08/2023 1336   K 4.4 02/08/2023 1336   CL 102 02/08/2023 1336   CO2 28 02/08/2023 1336   GLUCOSE 88 02/08/2023 1336   BUN 15 02/08/2023 1336   CREATININE 0.82 02/08/2023 1336   CALCIUM  10.0 02/08/2023 1336   GFRNONAA >60 03/19/2022 1030    Lipid Panel     Component Value Date/Time   CHOL 383 (H) 02/08/2023 1336   TRIG 90 02/08/2023 1336   HDL 75 02/08/2023 1336   CHOLHDL 5.1 (H) 02/08/2023 1336   VLDL 12.4 05/25/2019 1137   LDLCALC 287 (H) 02/08/2023 1336    CBC    Component Value Date/Time   WBC 5.0 05/07/2022 0847   RBC 4.59 05/07/2022 0847   HGB 14.5 05/07/2022 0847   HCT 42.5 05/07/2022 0847   HCT 41 10/16/2017 0000   PLT 228 05/07/2022 0847    MCV 92.6 05/07/2022 0847   MCH 31.6 05/07/2022 0847   MCHC 34.1 05/07/2022 0847   RDW 12.2 05/07/2022 0847   LYMPHSABS 1,666 01/26/2022 1044   MONOABS 0.4 06/21/2015 0831   EOSABS 140 01/26/2022 1044   BASOSABS 49 01/26/2022 1044    Hgb A1C Lab Results  Component Value Date   HGBA1C 4.9 01/12/2021            Assessment & Plan:   Preventative Health Maintenance:  Encouraged her to get a flu shot in the fall Tetanus UTD Encouraged her to get her COVID booster Discussed Shingrix vaccine, will check coverage with her insurance company and schedule a nurse visit if she would like to have this done Pap smear will be done by GYN Mammogram ordered-she will call to schedule She declines referral to GI for screening colonoscopy Encouraged her to consume a balanced diet and exercise regimen Advised her to see an eye doctor and dentist annually We will check CBC, c-Met, lipid and A1c oday  Urinary frequency:  PCOT urinalysis normal Push fluids No indication for abx or urine culture at this time  RTC in 6 months, follow-up chronic conditions Helayne Lo, NP

## 2023-08-19 NOTE — Addendum Note (Signed)
 Addended by: Arabella Knife D on: 08/19/2023 08:54 AM   Modules accepted: Orders

## 2023-08-19 NOTE — Patient Instructions (Signed)
 Health Maintenance for Postmenopausal Women Menopause is a normal process in which your ability to get pregnant comes to an end. This process happens slowly over many months or years, usually between the ages of 24 and 62. Menopause is complete when you have missed your menstrual period for 12 months. It is important to talk with your health care provider about some of the most common conditions that affect women after menopause (postmenopausal women). These include heart disease, cancer, and bone loss (osteoporosis). Adopting a healthy lifestyle and getting preventive care can help to promote your health and wellness. The actions you take can also lower your chances of developing some of these common conditions. What are the signs and symptoms of menopause? During menopause, you may have the following symptoms: Hot flashes. These can be moderate or severe. Night sweats. Decrease in sex drive. Mood swings. Headaches. Tiredness (fatigue). Irritability. Memory problems. Problems falling asleep or staying asleep. Talk with your health care provider about treatment options for your symptoms. Do I need hormone replacement therapy? Hormone replacement therapy is effective in treating symptoms that are caused by menopause, such as hot flashes and night sweats. Hormone replacement carries certain risks, especially as you become older. If you are thinking about using estrogen or estrogen with progestin, discuss the benefits and risks with your health care provider. How can I reduce my risk for heart disease and stroke? The risk of heart disease, heart attack, and stroke increases as you age. One of the causes may be a change in the body's hormones during menopause. This can affect how your body uses dietary fats, triglycerides, and cholesterol. Heart attack and stroke are medical emergencies. There are many things that you can do to help prevent heart disease and stroke. Watch your blood pressure High  blood pressure causes heart disease and increases the risk of stroke. This is more likely to develop in people who have high blood pressure readings or are overweight. Have your blood pressure checked: Every 3-5 years if you are 50-75 years of age. Every year if you are 77 years old or older. Eat a healthy diet  Eat a diet that includes plenty of vegetables, fruits, low-fat dairy products, and lean protein. Do not eat a lot of foods that are high in solid fats, added sugars, or sodium. Get regular exercise Get regular exercise. This is one of the most important things you can do for your health. Most adults should: Try to exercise for at least 150 minutes each week. The exercise should increase your heart rate and make you sweat (moderate-intensity exercise). Try to do strengthening exercises at least twice each week. Do these in addition to the moderate-intensity exercise. Spend less time sitting. Even light physical activity can be beneficial. Other tips Work with your health care provider to achieve or maintain a healthy weight. Do not use any products that contain nicotine or tobacco. These products include cigarettes, chewing tobacco, and vaping devices, such as e-cigarettes. If you need help quitting, ask your health care provider. Know your numbers. Ask your health care provider to check your cholesterol and your blood sugar (glucose). Continue to have your blood tested as directed by your health care provider. Do I need screening for cancer? Depending on your health history and family history, you may need to have cancer screenings at different stages of your life. This may include screening for: Breast cancer. Cervical cancer. Lung cancer. Colorectal cancer. What is my risk for osteoporosis? After menopause, you may be  at increased risk for osteoporosis. Osteoporosis is a condition in which bone destruction happens more quickly than new bone creation. To help prevent osteoporosis or  the bone fractures that can happen because of osteoporosis, you may take the following actions: If you are 61-3 years old, get at least 1,000 mg of calcium and at least 600 international units (IU) of vitamin D per day. If you are older than age 61 but younger than age 75, get at least 1,200 mg of calcium and at least 600 international units (IU) of vitamin D per day. If you are older than age 62, get at least 1,200 mg of calcium and at least 800 international units (IU) of vitamin D per day. Smoking and drinking excessive alcohol increase the risk of osteoporosis. Eat foods that are rich in calcium and vitamin D, and do weight-bearing exercises several times each week as directed by your health care provider. How does menopause affect my mental health? Depression may occur at any age, but it is more common as you become older. Common symptoms of depression include: Feeling depressed. Changes in sleep patterns. Changes in appetite or eating patterns. Feeling an overall lack of motivation or enjoyment of activities that you previously enjoyed. Frequent crying spells. Talk with your health care provider if you think that you are experiencing any of these symptoms. General instructions See your health care provider for regular wellness exams and vaccines. This may include: Scheduling regular health, dental, and eye exams. Getting and maintaining your vaccines. These include: Influenza vaccine. Get this vaccine each year before the flu season begins. Pneumonia vaccine. Shingles vaccine. Tetanus, diphtheria, and pertussis (Tdap) booster vaccine. Your health care provider may also recommend other immunizations. Tell your health care provider if you have ever been abused or do not feel safe at home. Summary Menopause is a normal process in which your ability to get pregnant comes to an end. This condition causes hot flashes, night sweats, decreased interest in sex, mood swings, headaches, or lack  of sleep. Treatment for this condition may include hormone replacement therapy. Take actions to keep yourself healthy, including exercising regularly, eating a healthy diet, watching your weight, and checking your blood pressure and blood sugar levels. Get screened for cancer and depression. Make sure that you are up to date with all your vaccines. This information is not intended to replace advice given to you by your health care provider. Make sure you discuss any questions you have with your health care provider. Document Revised: 09/05/2020 Document Reviewed: 09/05/2020 Elsevier Patient Education  2024 [employer].

## 2023-08-20 LAB — COMPREHENSIVE METABOLIC PANEL WITH GFR
AG Ratio: 1.7 (calc) (ref 1.0–2.5)
ALT: 14 U/L (ref 6–29)
AST: 18 U/L (ref 10–35)
Albumin: 4.4 g/dL (ref 3.6–5.1)
Alkaline phosphatase (APISO): 81 U/L (ref 37–153)
BUN: 14 mg/dL (ref 7–25)
CO2: 30 mmol/L (ref 20–32)
Calcium: 9.7 mg/dL (ref 8.6–10.4)
Chloride: 102 mmol/L (ref 98–110)
Creat: 0.87 mg/dL (ref 0.50–1.03)
Globulin: 2.6 g/dL (ref 1.9–3.7)
Glucose, Bld: 85 mg/dL (ref 65–139)
Potassium: 4.4 mmol/L (ref 3.5–5.3)
Sodium: 138 mmol/L (ref 135–146)
Total Bilirubin: 0.8 mg/dL (ref 0.2–1.2)
Total Protein: 7 g/dL (ref 6.1–8.1)
eGFR: 78 mL/min/{1.73_m2} (ref >=60)

## 2023-08-20 LAB — LIPID PANEL
Cholesterol: 350 mg/dL — ABNORMAL HIGH (ref <200)
HDL: 71 mg/dL (ref >=50)
LDL Cholesterol (Calc): 259 mg/dL — ABNORMAL HIGH
Non-HDL Cholesterol (Calc): 279 mg/dL — ABNORMAL HIGH (ref <130)
Total CHOL/HDL Ratio: 4.9 (calc) (ref <5.0)
Triglycerides: 85 mg/dL (ref <150)

## 2023-08-20 LAB — HEMOGLOBIN A1C
Hgb A1c MFr Bld: 5 % (ref <5.7)
Mean Plasma Glucose: 97 mg/dL
eAG (mmol/L): 5.4 mmol/L

## 2023-08-20 LAB — CBC
HCT: 39.8 % (ref 35.0–45.0)
Hemoglobin: 13.2 g/dL (ref 11.7–15.5)
MCH: 31.3 pg (ref 27.0–33.0)
MCHC: 33.2 g/dL (ref 32.0–36.0)
MCV: 94.3 fL (ref 80.0–100.0)
MPV: 10.1 fL (ref 7.5–12.5)
Platelets: 256 10*3/uL (ref 140–400)
RBC: 4.22 10*6/uL (ref 3.80–5.10)
RDW: 11.7 % (ref 11.0–15.0)
WBC: 5.2 10*3/uL (ref 3.8–10.8)

## 2023-08-20 MED ORDER — ATORVASTATIN CALCIUM 80 MG PO TABS: 80.0000 mg | ORAL_TABLET | Freq: Every day | ORAL | 1 refills | Status: AC

## 2023-08-20 NOTE — Addendum Note (Signed)
 Addended by: Carollynn Cirri on: 08/20/2023 08:01 AM   Modules accepted: Orders

## 2024-02-19 ENCOUNTER — Ambulatory Visit: Admitting: Internal Medicine

## 2024-02-19 NOTE — Progress Notes (Deleted)
 Subjective:    Patient ID: Amanda Parrish, female    DOB: Oct 06, 1965, 58 y.o.   MRN: [id number]  HPI  Patient presents to clinic today for 20-month follow-up of chronic conditions.  IBS: Mainly constipation.  She consumes a high-fiber diet and takes miraLAX as needed with good results.  Colonoscopy from 08/2015 reviewed.  HLD: Her last LDL was 259, triglycerides 85, 07/2023.  She denies myalgias on atorvastatin . She was prescribed repatha  but admits she did not pick this up.  She tries to consume a low-fat diet.  Anxiety and depression: Chronic, managed on bupropion  and clonazepam .  She is no longer taking escitalopram .  She is currently seeing a therapist.  She denies SI/HI.   Review of Systems     Past Medical History:  Diagnosis Date   Allergy    Anemia    Anxiety    Arthritis    hands and feet and hips    Depression    GERD (gastroesophageal reflux disease)    Hyperlipidemia    IBS (irritable bowel syndrome)    Status post dilation of esophageal narrowing     Current Outpatient Medications  Medication Sig Dispense Refill   atorvastatin  (LIPITOR) 80 MG tablet Take 1 tablet (80 mg total) by mouth daily. Take 1 tablet (40 mg total) by mouth daily at 6 PM. 90 tablet 1   buPROPion  (WELLBUTRIN  XL) 300 MG 24 hr tablet Take 1 tablet (300 mg total) by mouth daily. 90 tablet 1   clonazePAM  (KLONOPIN ) 1 MG tablet Take 1 tablet (1 mg total) by mouth daily as needed for anxiety. 30 tablet 0   Magnesium 250 MG TABS Take 250 mg by mouth daily.     VITAMIN D  PO Take 10,000 Units by mouth every other day.      No current facility-administered medications for this visit.    No Known Allergies  Family History  Problem Relation Age of Onset   Colon polyps Mother    Heart disease Father    Colon cancer Maternal Aunt    Breast cancer Paternal Aunt 79   Breast cancer Paternal Grandmother 65   Prostate cancer Paternal Grandfather    Rectal cancer Neg Hx    Stomach cancer Neg  Hx    Esophageal cancer Neg Hx     Social History   Socioeconomic History   Marital status: Married    Spouse name: Not on file   Number of children: Not on file   Years of education: Not on file   Highest education level: 12th grade  Occupational History   Not on file  Tobacco Use   Smoking status: Former    Current packs/day: 0.50    Types: Cigarettes   Smokeless tobacco: Never  Vaping Use   Vaping status: Never Used  Substance and Sexual Activity   Alcohol use: Yes    Comment: rarely   Drug use: No   Sexual activity: Yes    Birth control/protection: Surgical    Comment: BTL-1st intercourse 58 yo-More than 5 partners  Other Topics Concern   Not on file  Social History Narrative   Patient is married   She has 1 son and 1 daughter   She is a Solicitor of court-deputy clerk  in [place]      Former smoker 2 alcoholic beverages a month 1 or 2 caffeinated beverages daily   Social Drivers of Corporate investment banker Strain: Low Risk  (08/14/2023)   Overall  Financial Resource Strain (CARDIA)    Difficulty of Paying Living Expenses: Not very hard  Food Insecurity: No Food Insecurity (08/14/2023)   Hunger Vital Sign    Worried About Running Out of Food in the Last Year: Never true    Ran Out of Food in the Last Year: Never true  Transportation Needs: No Transportation Needs (08/14/2023)   PRAPARE - Administrator, Civil Service (Medical): No    Lack of Transportation (Non-Medical): No  Physical Activity: Insufficiently Active (08/14/2023)   Exercise Vital Sign    Days of Exercise per Week: 3 days    Minutes of Exercise per Session: 10 min  Stress: Stress Concern Present (08/14/2023)   [employer] of Occupational Health - Occupational Stress Questionnaire    Feeling of Stress : Very much  Social Connections: Moderately Integrated (08/14/2023)   Social Connection and Isolation Panel    Frequency of Communication with Friends and Family: Once a week     Frequency of Social Gatherings with Friends and Family: Three times a week    Attends Religious Services: More than 4 times per year    Active Member of Clubs or Organizations: No    Attends Engineer, structural: Not on file    Marital Status: Married  Catering manager Violence: Not on file     Constitutional: Denies fever, malaise, fatigue, headache or abrupt weight changes.  HEENT: Denies eye pain, eye redness, ear pain, ringing in the ears, wax buildup, runny nose, nasal congestion, bloody nose, or sore throat. Respiratory: Denies difficulty breathing, shortness of breath, cough or sputum production.   Cardiovascular: Denies chest pain, chest tightness, palpitations or swelling in the hands or feet.  Gastrointestinal: Patient reports constipation.  Denies abdominal pain, bloating, diarrhea or blood in the stool.  GU: Denies urgency, frequency, pain with urination, burning sensation, blood in urine, odor or discharge. Musculoskeletal: Denies decrease in range of motion, difficulty with gait, muscle pain or joint pain and swelling.  Skin: Denies redness, rashes, lesions or ulcercations.  Neurological: Denies dizziness, difficulty with memory, difficulty with speech or problems with balance and coordination.  Psych: Patient has a history of anxiety and depression.  Denies SI/HI.  No other specific complaints in a complete review of systems (except as listed in HPI above).  Objective:   Physical Exam   There were no vitals taken for this visit.  Wt Readings from Last 3 Encounters:  08/19/23 126 lb 3.2 oz (57.2 kg)  02/08/23 133 lb (60.3 kg)  05/18/22 155 lb (70.3 kg)    General: Appears her stated age, well developed, well nourished in NAD. Skin: Warm, dry and intact.  HEENT: Head: normal shape and size; Eyes: sclera white, no icterus, conjunctiva pink, PERRLA and EOMs intact;  Neck:  Neck supple, trachea midline. No masses, lumps or thyromegaly present.   Cardiovascular: Normal rate and rhythm. S1,S2 noted.  No murmur, rubs or gallops noted. No JVD or BLE edema. No carotid bruits noted. Pulmonary/Chest: Normal effort and positive vesicular breath sounds. No respiratory distress. No wheezes, rales or ronchi noted.  Musculoskeletal: No difficulty with gait.  Neurological: Alert and oriented. Coordination normal.  Psychiatric: Mood and affect flat.  Tearful. Judgment and thought content normal.   BMET    Component Value Date/Time   NA 138 08/19/2023 0838   K 4.4 08/19/2023 0838   CL 102 08/19/2023 0838   CO2 30 08/19/2023 0838   GLUCOSE 85 08/19/2023 0838   BUN 14  08/19/2023 0838   CREATININE 0.87 08/19/2023 0838   CALCIUM  9.7 08/19/2023 0838   GFRNONAA >60 03/19/2022 1030    Lipid Panel     Component Value Date/Time   CHOL 350 (H) 08/19/2023 0838   TRIG 85 08/19/2023 0838   HDL 71 08/19/2023 0838   CHOLHDL 4.9 08/19/2023 0838   VLDL 12.4 05/25/2019 1137   LDLCALC 259 (H) 08/19/2023 0838    CBC    Component Value Date/Time   WBC 5.2 08/19/2023 0838   RBC 4.22 08/19/2023 0838   HGB 13.2 08/19/2023 0838   HCT 39.8 08/19/2023 0838   HCT 41 10/16/2017 0000   PLT 256 08/19/2023 0838   MCV 94.3 08/19/2023 0838   MCH 31.3 08/19/2023 0838   MCHC 33.2 08/19/2023 0838   RDW 11.7 08/19/2023 0838   LYMPHSABS 1,666 01/26/2022 1044   MONOABS 0.4 06/21/2015 0831   EOSABS 140 01/26/2022 1044   BASOSABS 49 01/26/2022 1044    Hgb A1C Lab Results  Component Value Date   HGBA1C 5.0 08/19/2023           Assessment & Plan:     RTC in 6 months for your annual exam Angeline Laura, NP

## 2024-03-10 ENCOUNTER — Encounter: Payer: Self-pay | Admitting: Internal Medicine

## 2024-03-11 ENCOUNTER — Other Ambulatory Visit: Payer: Self-pay | Admitting: Internal Medicine

## 2024-03-11 MED ORDER — BUPROPION HCL ER (XL) 150 MG PO TB24: 150.0000 mg | ORAL_TABLET | Freq: Every day | ORAL | 1 refills | Status: DC

## 2024-03-11 NOTE — Telephone Encounter (Signed)
 She no-showed her follow-up appointment with me in October.  She needs to get this rescheduled and we can discuss this at that appointment

## 2024-03-13 NOTE — Telephone Encounter (Signed)
 Discontinued 03/11/24, dose change.  Requested Prescriptions  Pending Prescriptions Disp Refills   buPROPion  (WELLBUTRIN  XL) 300 MG 24 hr tablet [Pharmacy Med Name: BUPROPION  HCL XL 300 MG TABLET] 90 tablet 0    Sig: Take 1 tablet (300 mg total) by mouth daily.     Psychiatry: Antidepressants - bupropion  Failed - 03/13/2024 11:20 AM      Failed - Valid encounter within last 6 months    Recent Outpatient Visits           6 months ago Encounter for general adult medical examination with abnormal findings    [facility] [place], [place], NP              Passed - Cr in normal range and within 360 days    Creat  Date Value Ref Range Status  08/19/2023 0.87 0.50 - 1.03 mg/dL Final         Passed - AST in normal range and within 360 days    AST  Date Value Ref Range Status  08/19/2023 18 10 - 35 U/L Final         Passed - ALT in normal range and within 360 days    ALT  Date Value Ref Range Status  08/19/2023 14 6 - 29 U/L Final         Passed - Completed PHQ-2 or PHQ-9 in the last 360 days      Passed - Last BP in normal range    BP Readings from Last 1 Encounters:  08/19/23 108/68

## 2024-03-17 ENCOUNTER — Ambulatory Visit: Admitting: Internal Medicine

## 2024-04-08 ENCOUNTER — Ambulatory Visit: Admitting: Internal Medicine

## 2024-04-13 ENCOUNTER — Encounter: Payer: Self-pay | Admitting: Internal Medicine

## 2024-04-14 ENCOUNTER — Ambulatory Visit: Admitting: Internal Medicine

## 2024-04-14 ENCOUNTER — Encounter: Payer: Self-pay | Admitting: Internal Medicine

## 2024-04-14 VITALS — BP 122/82 | Ht 66.0 in | Wt 118.8 lb

## 2024-04-14 DIAGNOSIS — R531 Weakness: Secondary | ICD-10-CM

## 2024-04-14 DIAGNOSIS — R4189 Other symptoms and signs involving cognitive functions and awareness: Secondary | ICD-10-CM

## 2024-04-14 DIAGNOSIS — R5383 Other fatigue: Secondary | ICD-10-CM

## 2024-04-14 DIAGNOSIS — Z636 Dependent relative needing care at home: Secondary | ICD-10-CM

## 2024-04-14 DIAGNOSIS — F331 Major depressive disorder, recurrent, moderate: Secondary | ICD-10-CM | POA: Insufficient documentation

## 2024-04-14 DIAGNOSIS — E538 Deficiency of other specified B group vitamins: Secondary | ICD-10-CM

## 2024-04-14 DIAGNOSIS — F411 Generalized anxiety disorder: Secondary | ICD-10-CM | POA: Insufficient documentation

## 2024-04-14 DIAGNOSIS — E559 Vitamin D deficiency, unspecified: Secondary | ICD-10-CM

## 2024-04-14 MED ORDER — ESCITALOPRAM OXALATE 10 MG PO TABS: 10.0000 mg | ORAL_TABLET | Freq: Every day | ORAL | 0 refills | Status: DC

## 2024-04-14 NOTE — Progress Notes (Signed)
 Subjective:    Patient ID: Amanda Parrish, female    DOB: [birth year], 58 y.o.   MRN: [id number]  HPI  Discussed the use of AI scribe software for clinical note transcription with the patient, who gave verbal consent to proceed.   Amanda Parrish is a 58 year old female with anxiety and depression who presents with fatigue and lack of concentration.  Since November, she has experienced significant fatigue, lack of energy, and difficulty concentrating. She feels overwhelmed by her responsibilities, which include work, caring for her grandchildren, and being the primary caregiver for her mother who has dementia. She describes an inability to complete tasks and a decline in her overall well-being.  She notes changes in her appetite, sometimes feeling hungry but lacking the desire to eat. She frequently feels cold and experienced a fever with a severe headache this past Saturday, which resolved by the next day. Her sleep is somewhat disrupted; she wakes up during the night but is able to return to sleep. She describes her nerves as 'bad' and feels she cannot function properly.  She has a history of anxiety and depression and is currently taking Wellbutrin  and Klonopin . Klonopin  helps manage her anxiety. She previously took Lexapro  but discontinued it two years ago. She is seeing a therapist regularly, although she has missed recent appointments due to her busy schedule.  She underwent an ablation in the mid-2000s and entered menopause in her forties. She does not currently experience night sweats but has a history of them.     Review of Systems   Past Medical History:  Diagnosis Date   Allergy    Anemia    Anxiety    Arthritis    hands and feet and hips    Depression    GERD (gastroesophageal reflux disease)    Hyperlipidemia    IBS (irritable bowel syndrome)    Status post dilation of esophageal narrowing     Current Outpatient Medications  Medication Sig Dispense  Refill   atorvastatin  (LIPITOR) 80 MG tablet Take 1 tablet (80 mg total) by mouth daily. Take 1 tablet (40 mg total) by mouth daily at 6 PM. 90 tablet 1   buPROPion  (WELLBUTRIN  XL) 150 MG 24 hr tablet Take 1 tablet (150 mg total) by mouth daily. 180 tablet 1   clonazePAM  (KLONOPIN ) 1 MG tablet Take 1 tablet (1 mg total) by mouth daily as needed for anxiety. 30 tablet 0   Magnesium 250 MG TABS Take 250 mg by mouth daily.     VITAMIN D  PO Take 10,000 Units by mouth every other day.      No current facility-administered medications for this visit.    Allergies[1]  Family History  Problem Relation Age of Onset   Colon polyps Mother    Heart disease Father    Colon cancer Maternal Aunt    Breast cancer Paternal Aunt 68   Breast cancer Paternal Grandmother 54   Prostate cancer Paternal Grandfather    Rectal cancer Neg Hx    Stomach cancer Neg Hx    Esophageal cancer Neg Hx     Social History   Socioeconomic History   Marital status: Married    Spouse name: Not on file   Number of children: Not on file   Years of education: Not on file   Highest education level: 12th grade  Occupational History   Not on file  Tobacco Use   Smoking status: Former    Current packs/day: 0.50  Types: Cigarettes   Smokeless tobacco: Never  Vaping Use   Vaping status: Never Used  Substance and Sexual Activity   Alcohol use: Yes    Comment: rarely   Drug use: No   Sexual activity: Yes    Birth control/protection: Surgical    Comment: BTL-1st intercourse 58 yo-More than 5 partners  Other Topics Concern   Not on file  Social History Narrative   Patient is married   She has 1 son and 1 daughter   She is a solicitor of court-deputy clerk  in [place]      Former smoker 2 alcoholic beverages a month 1 or 2 caffeinated beverages daily   Social Drivers of Health   Tobacco Use: Medium Risk (08/19/2023)   Patient History    Smoking Tobacco Use: Former    Smokeless Tobacco Use: Never     Passive Exposure: Not on file  Financial Resource Strain: Low Risk (08/14/2023)   Overall Financial Resource Strain (CARDIA)    Difficulty of Paying Living Expenses: Not very hard  Food Insecurity: No Food Insecurity (08/14/2023)   Hunger Vital Sign    Worried About Running Out of Food in the Last Year: Never true    Ran Out of Food in the Last Year: Never true  Transportation Needs: No Transportation Needs (08/14/2023)   PRAPARE - Administrator, Civil Service (Medical): No    Lack of Transportation (Non-Medical): No  Physical Activity: Insufficiently Active (08/14/2023)   Exercise Vital Sign    Days of Exercise per Week: 3 days    Minutes of Exercise per Session: 10 min  Stress: Stress Concern Present (08/14/2023)   [employer] of Occupational Health - Occupational Stress Questionnaire    Feeling of Stress : Very much  Social Connections: Moderately Integrated (08/14/2023)   Social Connection and Isolation Panel    Frequency of Communication with Friends and Family: Once a week    Frequency of Social Gatherings with Friends and Family: Three times a week    Attends Religious Services: More than 4 times per year    Active Member of Clubs or Organizations: No    Attends Banker Meetings: Not on file    Marital Status: Married  Catering Manager Violence: Not on file  Depression (PHQ2-9): High Risk (08/19/2023)   Depression (PHQ2-9)    PHQ-2 Score: 13  Alcohol Screen: Low Risk (08/14/2023)   Alcohol Screen    Last Alcohol Screening Score (AUDIT): 1  Housing: Low Risk (08/14/2023)   Housing Stability Vital Sign    Unable to Pay for Housing in the Last Year: No    Number of Times Moved in the Last Year: 0    Homeless in the Last Year: No  Utilities: Not on file  Health Literacy: Not on file     Constitutional: Patient reports fatigue.  Denies fever, malaise, headache or abrupt weight changes.  HEENT: Denies eye pain, eye redness, ear pain, ringing in the  ears, wax buildup, runny nose, nasal congestion, bloody nose, or sore throat. Respiratory: Denies difficulty breathing, shortness of breath, cough or sputum production.   Cardiovascular: Denies chest pain, chest tightness, palpitations or swelling in the hands or feet.  Gastrointestinal: Pt reports poor appetite. Denies abdominal pain, bloating, constipation, diarrhea or blood in the stool.  GU: Denies urgency, frequency, pain with urination, burning sensation, blood in urine, odor or discharge. Musculoskeletal: Patient reports generalized weakness.  Denies decrease in range of motion, difficulty with  gait, muscle pain or joint pain and swelling.  Skin: Denies redness, rashes, lesions or ulcercations.  Neurological: Pt reports difficulty focusing, lack of motivation, cold intolerance, sensitivity to sound. Denies dizziness, difficulty with memory, difficulty with speech or problems with balance and coordination.  Psych: Patient has a history of anxiety and depression.  Denies SI/HI.  No other specific complaints in a complete review of systems (except as listed in HPI above).      Objective:   Physical Exam  BP 122/82 (BP Location: Left Arm, Patient Position: Sitting, Cuff Size: Normal)   Ht 5' 6 (1.676 m)   Wt 118 lb 12.8 oz (53.9 kg)   BMI 19.17 kg/m   Wt Readings from Last 3 Encounters:  08/19/23 126 lb 3.2 oz (57.2 kg)  02/08/23 133 lb (60.3 kg)  05/18/22 155 lb (70.3 kg)    General: Appears her stated age, well developed, well nourished in NAD. Skin: Warm, dry and intact.  HEENT: Head: normal shape and size; Eyes: sclera white, no icterus, conjunctiva pink, PERRLA and EOMs intact;  Neck:  Neck supple, trachea midline. No masses, lumps or thyromegaly present.  Cardiovascular: Normal rate and rhythm. Pulmonary/Chest: Normal effort. No respiratory distress.  Musculoskeletal:  No difficulty with gait.  Neurological: Alert and oriented. Coordination normal.  Psychiatric: Mood  and affect mildly flat.  Tearful and anxious appearing.  Judgment and thought content normal.    BMET    Component Value Date/Time   NA 138 08/19/2023 0838   K 4.4 08/19/2023 0838   CL 102 08/19/2023 0838   CO2 30 08/19/2023 0838   GLUCOSE 85 08/19/2023 0838   BUN 14 08/19/2023 0838   CREATININE 0.87 08/19/2023 0838   CALCIUM  9.7 08/19/2023 0838   GFRNONAA >60 03/19/2022 1030    Lipid Panel     Component Value Date/Time   CHOL 350 (H) 08/19/2023 0838   TRIG 85 08/19/2023 0838   HDL 71 08/19/2023 0838   CHOLHDL 4.9 08/19/2023 0838   VLDL 12.4 05/25/2019 1137   LDLCALC 259 (H) 08/19/2023 0838    CBC    Component Value Date/Time   WBC 5.2 08/19/2023 0838   RBC 4.22 08/19/2023 0838   HGB 13.2 08/19/2023 0838   HCT 39.8 08/19/2023 0838   HCT 41 10/16/2017 0000   PLT 256 08/19/2023 0838   MCV 94.3 08/19/2023 0838   MCH 31.3 08/19/2023 0838   MCHC 33.2 08/19/2023 0838   RDW 11.7 08/19/2023 0838   LYMPHSABS 1,666 01/26/2022 1044   MONOABS 0.4 06/21/2015 0831   EOSABS 140 01/26/2022 1044   BASOSABS 49 01/26/2022 1044    Hgb A1C Lab Results  Component Value Date   HGBA1C 5.0 08/19/2023            Assessment & Plan:    Assessment and Plan    Major depressive disorder with anxiety Depressive symptoms persist despite anxiety management with Klonopin . Stress from caregiving and possible menopausal symptoms may contribute. - Restarted escitalopram  at 10 mg daily for one month, then increase to 20 mg if tolerated. - Continue Wellbutrin  150 mg XL daily and clonazepam  1 mg daily as needed - Ordered CBC, thyroid  function tests, vitamin B12, and vitamin D  levels. - Consider referral for bioidentical hormone therapy if labs are normal and symptoms persist.  Caregiver stress Significant stress from caregiving responsibilities may exacerbate depressive symptoms. - Continue therapy sessions every two weeks.  Evaluation of fatigue and generalized weakness Fatigue  and weakness may be related  to depression, anxiety, or vitamin deficiencies. - Ordered CBC, thyroid  function tests, vitamin B12, and vitamin D  levels.  Screening for vitamin D  and B12 deficiency Screening for deficiencies as she may contribute to fatigue and weakness. - Ordered vitamin B12 and vitamin D  levels. - Consider prescription vitamin D  supplementation if low. - Recommend over-the-counter B12 supplementation if low.       Schedule an appointment for follow-up of chronic conditions Angeline Laura, NP      [1] No Known Allergies

## 2024-04-14 NOTE — Patient Instructions (Signed)
 Preventing Caregiver Burnout This video will help you recognize caregiver burnout and describe ways to manage the stress that can come with caring for someone with a chronic illness. To view the content, go to this web address: [email or web address]  This video will expire on: 04/10/2025. If you need access to this video following this date, please reach out to the healthcare provider who assigned it to you. This information is not intended to replace advice given to you by your health care provider. Make sure you discuss any questions you have with your health care provider. Elsevier Patient Education  2024 [employer].

## 2024-04-15 ENCOUNTER — Ambulatory Visit: Payer: Self-pay | Admitting: Internal Medicine

## 2024-04-15 LAB — CBC
HCT: 44.5 % (ref 35.9–46.0)
Hemoglobin: 14.3 g/dL (ref 11.7–15.5)
MCH: 29.9 pg (ref 27.0–33.0)
MCHC: 32.1 g/dL (ref 31.6–35.4)
MCV: 92.9 fL (ref 81.4–101.7)
MPV: 10.4 fL (ref 7.5–12.5)
Platelets: 253 Thousand/uL (ref 140–400)
RBC: 4.79 Million/uL (ref 3.80–5.10)
RDW: 11.8 % (ref 11.0–15.0)
WBC: 4.8 Thousand/uL (ref 3.8–10.8)

## 2024-04-15 LAB — VITAMIN B12: Vitamin B-12: 744 pg/mL (ref 200–1100)

## 2024-04-15 LAB — TSH: TSH: 2.08 m[IU]/L (ref 0.40–4.50)

## 2024-04-15 LAB — VITAMIN D 25 HYDROXY (VIT D DEFICIENCY, FRACTURES): Vit D, 25-Hydroxy: 31 ng/mL (ref 30–100)

## 2024-04-27 ENCOUNTER — Encounter: Payer: Self-pay | Admitting: Internal Medicine

## 2024-04-27 ENCOUNTER — Ambulatory Visit: Admitting: Internal Medicine

## 2024-04-27 MED ORDER — BUPROPION HCL ER (XL) 150 MG PO TB24: 150.0000 mg | ORAL_TABLET | Freq: Two times a day (BID) | ORAL | 1 refills | Status: AC

## 2024-05-11 ENCOUNTER — Ambulatory Visit: Admitting: Internal Medicine

## 2024-05-11 ENCOUNTER — Other Ambulatory Visit: Payer: Self-pay | Admitting: Internal Medicine

## 2024-05-11 NOTE — Progress Notes (Unsigned)
 "  Subjective:    Patient ID: Amanda Parrish, female    DOB: 1966/02/25, 59 y.o.   MRN: [id number]  HPI  Patient presents to clinic today for follow-up of chronic conditions.  IBS: Mainly constipation.  She consumes a high-fiber diet and takes miraLAX as needed with good results.  Colonoscopy from 08/2015 reviewed.  HLD: Her last LDL was 259, triglycerides 85, 07/2023.  She denies myalgias on atorvastatin . She was prescribed repatha  but admits she did not pick this up.  She tries to consume a low-fat diet.  Anxiety and depression (moderate, recurrent): Chronic, managed on escitalopram , bupropion  and clonazepam .  She has been under a lot of stress lately with work. She is currently seeing a therapist.  She denies SI/HI.   Review of Systems     Past Medical History:  Diagnosis Date   Allergy    Anemia    Anxiety    Arthritis    hands and feet and hips    Depression    GERD (gastroesophageal reflux disease)    Hyperlipidemia    IBS (irritable bowel syndrome)    Status post dilation of esophageal narrowing     Current Outpatient Medications  Medication Sig Dispense Refill   atorvastatin  (LIPITOR) 80 MG tablet Take 1 tablet (80 mg total) by mouth daily. Take 1 tablet (40 mg total) by mouth daily at 6 PM. (Patient not taking: Reported on 04/14/2024) 90 tablet 1   buPROPion  (WELLBUTRIN  XL) 150 MG 24 hr tablet Take 1 tablet (150 mg total) by mouth 2 (two) times daily. 180 tablet 1   clonazePAM  (KLONOPIN ) 1 MG tablet Take 1 tablet (1 mg total) by mouth daily as needed for anxiety. 30 tablet 0   escitalopram  (LEXAPRO ) 10 MG tablet Take 1 tablet (10 mg total) by mouth daily. 30 tablet 0   Magnesium 250 MG TABS Take 250 mg by mouth daily.     VITAMIN D  PO Take 10,000 Units by mouth every other day.      No current facility-administered medications for this visit.    No Known Allergies  Family History  Problem Relation Age of Onset   Colon polyps Mother    Heart disease Father     Colon cancer Maternal Aunt    Breast cancer Paternal Aunt 72   Breast cancer Paternal Grandmother 61   Prostate cancer Paternal Grandfather    Rectal cancer Neg Hx    Stomach cancer Neg Hx    Esophageal cancer Neg Hx     Social History   Socioeconomic History   Marital status: Married    Spouse name: Not on file   Number of children: Not on file   Years of education: Not on file   Highest education level: 12th grade  Occupational History   Not on file  Tobacco Use   Smoking status: Former    Current packs/day: 0.50    Types: Cigarettes   Smokeless tobacco: Never  Vaping Use   Vaping status: Never Used  Substance and Sexual Activity   Alcohol use: Yes    Comment: rarely   Drug use: No   Sexual activity: Yes    Birth control/protection: Surgical    Comment: BTL-1st intercourse 59 yo-More than 5 partners  Other Topics Concern   Not on file  Social History Narrative   Patient is married   She has 1 son and 1 daughter   She is a solicitor of psychologist, educational  in [place]  Former smoker 2 alcoholic beverages a month 1 or 2 caffeinated beverages daily   Social Drivers of Health   Tobacco Use: Medium Risk (04/14/2024)   Patient History    Smoking Tobacco Use: Former    Smokeless Tobacco Use: Never    Passive Exposure: Not on file  Financial Resource Strain: Low Risk (08/14/2023)   Overall Financial Resource Strain (CARDIA)    Difficulty of Paying Living Expenses: Not very hard  Food Insecurity: No Food Insecurity (08/14/2023)   Hunger Vital Sign    Worried About Running Out of Food in the Last Year: Never true    Ran Out of Food in the Last Year: Never true  Transportation Needs: No Transportation Needs (08/14/2023)   PRAPARE - Administrator, Civil Service (Medical): No    Lack of Transportation (Non-Medical): No  Physical Activity: Insufficiently Active (08/14/2023)   Exercise Vital Sign    Days of Exercise per Week: 3 days    Minutes of  Exercise per Session: 10 min  Stress: Stress Concern Present (08/14/2023)   [employer] of Occupational Health - Occupational Stress Questionnaire    Feeling of Stress : Very much  Social Connections: Moderately Integrated (08/14/2023)   Social Connection and Isolation Panel    Frequency of Communication with Friends and Family: Once a week    Frequency of Social Gatherings with Friends and Family: Three times a week    Attends Religious Services: More than 4 times per year    Active Member of Clubs or Organizations: No    Attends Banker Meetings: Not on file    Marital Status: Married  Catering Manager Violence: Not on file  Depression (PHQ2-9): High Risk (04/14/2024)   Depression (PHQ2-9)    PHQ-2 Score: 22  Alcohol Screen: Low Risk (08/14/2023)   Alcohol Screen    Last Alcohol Screening Score (AUDIT): 1  Housing: Low Risk (08/14/2023)   Housing Stability Vital Sign    Unable to Pay for Housing in the Last Year: No    Number of Times Moved in the Last Year: 0    Homeless in the Last Year: No  Utilities: Not on file  Health Literacy: Not on file     Constitutional: Denies fever, malaise, fatigue, headache or abrupt weight changes.  HEENT: Denies eye pain, eye redness, ear pain, ringing in the ears, wax buildup, runny nose, nasal congestion, bloody nose, or sore throat. Respiratory: Denies difficulty breathing, shortness of breath, cough or sputum production.   Cardiovascular: Denies chest pain, chest tightness, palpitations or swelling in the hands or feet.  Gastrointestinal: Patient reports constipation.  Denies abdominal pain, bloating, diarrhea or blood in the stool.  GU: Denies urgency, frequency, pain with urination, burning sensation, blood in urine, odor or discharge. Musculoskeletal: Denies decrease in range of motion, difficulty with gait, muscle pain or joint pain and swelling.  Skin: Denies redness, rashes, lesions or ulcercations.  Neurological:  Denies dizziness, difficulty with memory, difficulty with speech or problems with balance and coordination.  Psych: Patient has a history of anxiety and depression.  Denies SI/HI.  No other specific complaints in a complete review of systems (except as listed in HPI above).  Objective:   Physical Exam   There were no vitals taken for this visit.  Wt Readings from Last 3 Encounters:  04/14/24 118 lb 12.8 oz (53.9 kg)  08/19/23 126 lb 3.2 oz (57.2 kg)  02/08/23 133 lb (60.3 kg)    General: Appears  her stated age, well developed, well nourished in NAD. Skin: Warm, dry and intact.  HEENT: Head: normal shape and size; Eyes: sclera white, no icterus, conjunctiva pink, PERRLA and EOMs intact;  Neck:  Neck supple, trachea midline. No masses, lumps or thyromegaly present.  Cardiovascular: Normal rate and rhythm. S1,S2 noted.  No murmur, rubs or gallops noted. No JVD or BLE edema. No carotid bruits noted. Pulmonary/Chest: Normal effort and positive vesicular breath sounds. No respiratory distress. No wheezes, rales or ronchi noted.  Musculoskeletal: No difficulty with gait.  Neurological: Alert and oriented. Coordination normal.  Psychiatric: Mood and affect flat.  Tearful. Judgment and thought content normal.   BMET    Component Value Date/Time   NA 138 08/19/2023 0838   K 4.4 08/19/2023 0838   CL 102 08/19/2023 0838   CO2 30 08/19/2023 0838   GLUCOSE 85 08/19/2023 0838   BUN 14 08/19/2023 0838   CREATININE 0.87 08/19/2023 0838   CALCIUM  9.7 08/19/2023 0838   GFRNONAA >60 03/19/2022 1030    Lipid Panel     Component Value Date/Time   CHOL 350 (H) 08/19/2023 0838   TRIG 85 08/19/2023 0838   HDL 71 08/19/2023 0838   CHOLHDL 4.9 08/19/2023 0838   VLDL 12.4 05/25/2019 1137   LDLCALC 259 (H) 08/19/2023 0838    CBC    Component Value Date/Time   WBC 4.8 04/14/2024 1434   RBC 4.79 04/14/2024 1434   HGB 14.3 04/14/2024 1434   HCT 44.5 04/14/2024 1434   HCT 41 10/16/2017 0000    PLT 253 04/14/2024 1434   MCV 92.9 04/14/2024 1434   MCH 29.9 04/14/2024 1434   MCHC 32.1 04/14/2024 1434   RDW 11.8 04/14/2024 1434   LYMPHSABS 1,666 01/26/2022 1044   MONOABS 0.4 06/21/2015 0831   EOSABS 140 01/26/2022 1044   BASOSABS 49 01/26/2022 1044    Hgb A1C Lab Results  Component Value Date   HGBA1C 5.0 08/19/2023           Assessment & Plan:     RTC in 6 months for your annual exam Angeline Laura, NP  "

## 2024-05-12 NOTE — Telephone Encounter (Signed)
 Requested medication (s) are due for refill today: yes  Requested medication (s) are on the active medication list: yes  Last refill:  08/05/23  Future visit scheduled: yes  Notes to clinic:  Unable to refill per protocol, cannot delegate.      Requested Prescriptions  Pending Prescriptions Disp Refills   clonazePAM  (KLONOPIN ) 1 MG tablet [Pharmacy Med Name: CLONAZEPAM  1 MG TABLET] 30 tablet 0    Sig: Take 1 tablet (1 mg total) by mouth daily as needed for anxiety.     Not Delegated - Psychiatry: Anxiolytics/Hypnotics 2 Failed - 05/12/2024  2:35 PM      Failed - This refill cannot be delegated      Failed - Urine Drug Screen completed in last 360 days      Failed - Valid encounter within last 6 months    Recent Outpatient Visits           4 weeks ago GAD (generalized anxiety disorder)   [place] [facility] [place], Angeline ORN, NP   8 months ago Encounter for general adult medical examination with abnormal findings    [facility] [place], Angeline ORN, [place]              Passed - Patient is not pregnant

## 2024-05-13 ENCOUNTER — Encounter: Payer: Self-pay | Admitting: Internal Medicine

## 2024-05-13 ENCOUNTER — Ambulatory Visit (INDEPENDENT_AMBULATORY_CARE_PROVIDER_SITE_OTHER): Admitting: Internal Medicine

## 2024-05-13 VITALS — BP 124/78 | Ht 66.0 in | Wt 123.0 lb

## 2024-05-13 DIAGNOSIS — F411 Generalized anxiety disorder: Secondary | ICD-10-CM | POA: Diagnosis not present

## 2024-05-13 DIAGNOSIS — K581 Irritable bowel syndrome with constipation: Secondary | ICD-10-CM | POA: Diagnosis not present

## 2024-05-13 DIAGNOSIS — F331 Major depressive disorder, recurrent, moderate: Secondary | ICD-10-CM | POA: Diagnosis not present

## 2024-05-13 DIAGNOSIS — E7800 Pure hypercholesterolemia, unspecified: Secondary | ICD-10-CM

## 2024-05-13 MED ORDER — CLONAZEPAM 1 MG PO TABS: 1.0000 mg | ORAL_TABLET | Freq: Every day | ORAL | 0 refills | Status: AC | PRN

## 2024-05-13 MED ORDER — ESCITALOPRAM OXALATE 10 MG PO TABS: 10.0000 mg | ORAL_TABLET | Freq: Every day | ORAL | 1 refills | Status: AC

## 2024-05-13 NOTE — Assessment & Plan Note (Signed)
 Will check lipid profile at annual exam Encouraged her to consume a low-fat diet Continue red yeast rice for now May need to discuss statin therapy pending labs

## 2024-05-13 NOTE — Progress Notes (Signed)
 "  Subjective:    Patient ID: Amanda Parrish, female    DOB: [birth year], 59 y.o.   MRN: [id number]  HPI  Patient presents to clinic today for follow-up of chronic conditions.  IBS: Mainly constipation.  She consumes a high-fiber diet and takes miralax as needed with good results.  Colonoscopy from 08/2015 reviewed.  HLD: Her last LDL was 259, triglycerides 85, 07/2023.  She is not taking atorvastatin  as prescribed but is taking red yeast rice. She was prescribed repatha  but admits she did not pick this up.  She tries to consume a low-fat diet.  Anxiety and depression (moderate, recurrent): Chronic, managed on escitalopram , bupropion  and clonazepam .  She has been under a lot of stress lately with work. She is currently seeing a therapist.  She denies SI/HI.   Review of Systems     Past Medical History:  Diagnosis Date   Allergy    Anemia    Anxiety    Arthritis    hands and feet and hips    Depression    GERD (gastroesophageal reflux disease)    Hyperlipidemia    IBS (irritable bowel syndrome)    Status post dilation of esophageal narrowing     Current Outpatient Medications  Medication Sig Dispense Refill   atorvastatin  (LIPITOR) 80 MG tablet Take 1 tablet (80 mg total) by mouth daily. Take 1 tablet (40 mg total) by mouth daily at 6 PM. (Patient not taking: Reported on 04/14/2024) 90 tablet 1   buPROPion  (WELLBUTRIN  XL) 150 MG 24 hr tablet Take 1 tablet (150 mg total) by mouth 2 (two) times daily. 180 tablet 1   clonazePAM  (KLONOPIN ) 1 MG tablet Take 1 tablet (1 mg total) by mouth daily as needed for anxiety. 30 tablet 0   escitalopram  (LEXAPRO ) 10 MG tablet Take 1 tablet (10 mg total) by mouth daily. 30 tablet 0   Magnesium 250 MG TABS Take 250 mg by mouth daily.     VITAMIN D  PO Take 10,000 Units by mouth every other day.      No current facility-administered medications for this visit.    No Known Allergies  Family History  Problem Relation Age of Onset   Colon  polyps Mother    Heart disease Father    Colon cancer Maternal Aunt    Breast cancer Paternal Aunt 30   Breast cancer Paternal Grandmother 39   Prostate cancer Paternal Grandfather    Rectal cancer Neg Hx    Stomach cancer Neg Hx    Esophageal cancer Neg Hx     Social History   Socioeconomic History   Marital status: Married    Spouse name: Not on file   Number of children: Not on file   Years of education: Not on file   Highest education level: 12th grade  Occupational History   Not on file  Tobacco Use   Smoking status: Former    Current packs/day: 0.50    Types: Cigarettes   Smokeless tobacco: Never  Vaping Use   Vaping status: Never Used  Substance and Sexual Activity   Alcohol use: Yes    Comment: rarely   Drug use: No   Sexual activity: Yes    Birth control/protection: Surgical    Comment: BTL-1st intercourse 59 yo-More than 5 partners  Other Topics Concern   Not on file  Social History Narrative   Patient is married   She has 1 son and 1 daughter   She is a solicitor  of court-deputy clerk  in [employer]      Former smoker 2 alcoholic beverages a month 1 or 2 caffeinated beverages daily   Social Drivers of Health   Tobacco Use: Medium Risk (04/14/2024)   Patient History    Smoking Tobacco Use: Former    Smokeless Tobacco Use: Never    Passive Exposure: Not on file  Financial Resource Strain: Low Risk (08/14/2023)   Overall Financial Resource Strain (CARDIA)    Difficulty of Paying Living Expenses: Not very hard  Food Insecurity: No Food Insecurity (08/14/2023)   Hunger Vital Sign    Worried About Running Out of Food in the Last Year: Never true    Ran Out of Food in the Last Year: Never true  Transportation Needs: No Transportation Needs (08/14/2023)   PRAPARE - Administrator, Civil Service (Medical): No    Lack of Transportation (Non-Medical): No  Physical Activity: Insufficiently Active (08/14/2023)   Exercise Vital Sign    Days of  Exercise per Week: 3 days    Minutes of Exercise per Session: 10 min  Stress: Stress Concern Present (08/14/2023)   [employer] of Occupational Health - Occupational Stress Questionnaire    Feeling of Stress : Very much  Social Connections: Moderately Integrated (08/14/2023)   Social Connection and Isolation Panel    Frequency of Communication with Friends and Family: Once a week    Frequency of Social Gatherings with Friends and Family: Three times a week    Attends Religious Services: More than 4 times per year    Active Member of Clubs or Organizations: No    Attends Banker Meetings: Not on file    Marital Status: Married  Catering Manager Violence: Not on file  Depression (PHQ2-9): High Risk (04/14/2024)   Depression (PHQ2-9)    PHQ-2 Score: 22  Alcohol Screen: Low Risk (08/14/2023)   Alcohol Screen    Last Alcohol Screening Score (AUDIT): 1  Housing: Low Risk (08/14/2023)   Housing Stability Vital Sign    Unable to Pay for Housing in the Last Year: No    Number of Times Moved in the Last Year: 0    Homeless in the Last Year: No  Utilities: Not on file  Health Literacy: Not on file     Constitutional: Denies fever, malaise, fatigue, headache or abrupt weight changes.  HEENT: Denies eye pain, eye redness, ear pain, ringing in the ears, wax buildup, runny nose, nasal congestion, bloody nose, or sore throat. Respiratory: Denies difficulty breathing, shortness of breath, cough or sputum production.   Cardiovascular: Denies chest pain, chest tightness, palpitations or swelling in the hands or feet.  Gastrointestinal: Patient reports constipation.  Denies abdominal pain, bloating, diarrhea or blood in the stool.  GU: Denies urgency, frequency, pain with urination, burning sensation, blood in urine, odor or discharge. Musculoskeletal: Denies decrease in range of motion, difficulty with gait, muscle pain or joint pain and swelling.  Skin: Denies redness, rashes,  lesions or ulcercations.  Neurological: Denies dizziness, difficulty with memory, difficulty with speech or problems with balance and coordination.  Psych: Patient has a history of anxiety and depression.  Denies SI/HI.  No other specific complaints in a complete review of systems (except as listed in HPI above).  Objective:   Physical Exam  BP 124/78 (BP Location: Left Arm, Patient Position: Sitting, Cuff Size: Normal)   Ht 5' 6 (1.676 m)   Wt 123 lb (55.8 kg)   BMI 19.85 kg/m  Wt Readings from Last 3 Encounters:  04/14/24 118 lb 12.8 oz (53.9 kg)  08/19/23 126 lb 3.2 oz (57.2 kg)  02/08/23 133 lb (60.3 kg)    General: Appears her stated age, well developed, well nourished in NAD. Skin: Warm, dry and intact.  HEENT: Head: normal shape and size; Eyes: sclera white, no icterus, conjunctiva pink, PERRLA and EOMs intact;  Cardiovascular: Normal rate and rhythm.  Pulmonary/Chest: Normal effort. No respiratory distress.  Musculoskeletal: No difficulty with gait.  Neurological: Alert and oriented. Coordination normal.  Psychiatric: Mood and affect flat.  Behavior is normal. Judgment and thought content normal.   BMET    Component Value Date/Time   NA 138 08/19/2023 0838   K 4.4 08/19/2023 0838   CL 102 08/19/2023 0838   CO2 30 08/19/2023 0838   GLUCOSE 85 08/19/2023 0838   BUN 14 08/19/2023 0838   CREATININE 0.87 08/19/2023 0838   CALCIUM  9.7 08/19/2023 0838   GFRNONAA >60 03/19/2022 1030    Lipid Panel     Component Value Date/Time   CHOL 350 (H) 08/19/2023 0838   TRIG 85 08/19/2023 0838   HDL 71 08/19/2023 0838   CHOLHDL 4.9 08/19/2023 0838   VLDL 12.4 05/25/2019 1137   LDLCALC 259 (H) 08/19/2023 0838    CBC    Component Value Date/Time   WBC 4.8 04/14/2024 1434   RBC 4.79 04/14/2024 1434   HGB 14.3 04/14/2024 1434   HCT 44.5 04/14/2024 1434   HCT 41 10/16/2017 0000   PLT 253 04/14/2024 1434   MCV 92.9 04/14/2024 1434   MCH 29.9 04/14/2024 1434   MCHC  32.1 04/14/2024 1434   RDW 11.8 04/14/2024 1434   LYMPHSABS 1,666 01/26/2022 1044   MONOABS 0.4 06/21/2015 0831   EOSABS 140 01/26/2022 1044   BASOSABS 49 01/26/2022 1044    Hgb A1C Lab Results  Component Value Date   HGBA1C 5.0 08/19/2023           Assessment & Plan:     RTC in 4 months for your annual exam Angeline Laura, NP "

## 2024-05-13 NOTE — Assessment & Plan Note (Signed)
 Encouraged high fiber diet and adequate water intake Continue mirilax as needed

## 2024-05-13 NOTE — Assessment & Plan Note (Signed)
 Improved on escitalopram  10 mg daily, bupropion  150 mg XL twice daily and clonazepam  1 mg daily as needed She will continue to meet with her therapist every 2 weeks Support offered

## 2024-05-13 NOTE — Patient Instructions (Signed)

## 2024-09-09 ENCOUNTER — Encounter: Admitting: Internal Medicine
